# Patient Record
Sex: Female | Born: 1985 | Race: White | Hispanic: No | Marital: Married | Smoking: Former smoker
Health system: Southern US, Community
[De-identification: ages and names within clinical notes are randomized; demographics above are authoritative.]

## PROBLEM LIST (undated history)

## (undated) DIAGNOSIS — F419 Anxiety disorder, unspecified: Secondary | ICD-10-CM

## (undated) DIAGNOSIS — F32A Depression, unspecified: Secondary | ICD-10-CM

## (undated) DIAGNOSIS — F329 Major depressive disorder, single episode, unspecified: Secondary | ICD-10-CM

## (undated) DIAGNOSIS — R569 Unspecified convulsions: Secondary | ICD-10-CM

## (undated) HISTORY — DX: Anxiety disorder, unspecified: F41.9

## (undated) HISTORY — PX: ABDOMINAL HYSTERECTOMY: SHX81

## (undated) HISTORY — DX: Unspecified convulsions: R56.9

## (undated) HISTORY — PX: HERNIA REPAIR: SHX51

## (undated) HISTORY — PX: OVARIAN CYST SURGERY: SHX726

## (undated) HISTORY — PX: FRACTURE SURGERY: SHX138

---

## 2004-05-05 ENCOUNTER — Observation Stay: Payer: Self-pay

## 2004-05-10 ENCOUNTER — Observation Stay: Payer: Self-pay | Admitting: Obstetrics and Gynecology

## 2004-05-23 ENCOUNTER — Observation Stay: Payer: Self-pay

## 2004-06-01 ENCOUNTER — Inpatient Hospital Stay: Payer: Self-pay

## 2005-04-25 ENCOUNTER — Emergency Department: Payer: Self-pay | Admitting: Emergency Medicine

## 2005-07-22 ENCOUNTER — Emergency Department: Payer: Self-pay | Admitting: Emergency Medicine

## 2005-09-11 ENCOUNTER — Emergency Department: Payer: Self-pay | Admitting: Emergency Medicine

## 2005-09-26 ENCOUNTER — Emergency Department: Payer: Self-pay | Admitting: Unknown Physician Specialty

## 2006-01-26 ENCOUNTER — Observation Stay: Payer: Self-pay

## 2006-01-27 ENCOUNTER — Observation Stay: Payer: Self-pay | Admitting: Obstetrics and Gynecology

## 2006-01-30 ENCOUNTER — Observation Stay: Payer: Self-pay | Admitting: Obstetrics and Gynecology

## 2006-02-03 ENCOUNTER — Inpatient Hospital Stay: Payer: Self-pay

## 2006-05-12 ENCOUNTER — Emergency Department: Payer: Self-pay | Admitting: Emergency Medicine

## 2006-05-20 ENCOUNTER — Ambulatory Visit: Payer: Self-pay | Admitting: Emergency Medicine

## 2006-05-20 ENCOUNTER — Emergency Department: Payer: Self-pay | Admitting: Emergency Medicine

## 2006-07-11 ENCOUNTER — Ambulatory Visit: Payer: Self-pay | Admitting: Obstetrics and Gynecology

## 2006-08-06 ENCOUNTER — Emergency Department: Payer: Self-pay | Admitting: Emergency Medicine

## 2006-10-07 ENCOUNTER — Ambulatory Visit: Payer: Self-pay | Admitting: Obstetrics and Gynecology

## 2006-10-10 ENCOUNTER — Inpatient Hospital Stay: Payer: Self-pay | Admitting: Obstetrics and Gynecology

## 2007-04-07 ENCOUNTER — Emergency Department: Payer: Self-pay | Admitting: Internal Medicine

## 2007-04-17 ENCOUNTER — Emergency Department: Payer: Self-pay | Admitting: Emergency Medicine

## 2007-07-19 ENCOUNTER — Ambulatory Visit: Payer: Self-pay | Admitting: Family Medicine

## 2007-07-27 ENCOUNTER — Ambulatory Visit: Payer: Self-pay | Admitting: Family Medicine

## 2007-08-01 ENCOUNTER — Observation Stay: Payer: Self-pay

## 2007-10-02 ENCOUNTER — Observation Stay: Payer: Self-pay

## 2007-10-16 ENCOUNTER — Inpatient Hospital Stay: Payer: Self-pay

## 2008-02-21 ENCOUNTER — Emergency Department: Payer: Self-pay | Admitting: Unknown Physician Specialty

## 2008-03-05 ENCOUNTER — Emergency Department: Payer: Self-pay | Admitting: Emergency Medicine

## 2008-03-17 ENCOUNTER — Emergency Department: Payer: Self-pay | Admitting: Emergency Medicine

## 2008-04-06 ENCOUNTER — Ambulatory Visit: Payer: Self-pay | Admitting: Family Medicine

## 2008-05-15 ENCOUNTER — Emergency Department: Payer: Self-pay | Admitting: Emergency Medicine

## 2008-06-13 ENCOUNTER — Encounter: Payer: Self-pay | Admitting: Maternal & Fetal Medicine

## 2008-07-29 ENCOUNTER — Observation Stay: Payer: Self-pay

## 2008-09-10 ENCOUNTER — Observation Stay: Payer: Self-pay | Admitting: Obstetrics and Gynecology

## 2008-10-04 ENCOUNTER — Observation Stay: Payer: Self-pay

## 2008-10-06 ENCOUNTER — Observation Stay: Payer: Self-pay

## 2008-10-22 ENCOUNTER — Inpatient Hospital Stay: Payer: Self-pay

## 2009-02-13 ENCOUNTER — Ambulatory Visit: Payer: Self-pay | Admitting: Unknown Physician Specialty

## 2009-02-20 ENCOUNTER — Inpatient Hospital Stay: Payer: Self-pay | Admitting: Unknown Physician Specialty

## 2009-09-03 ENCOUNTER — Emergency Department: Payer: Self-pay | Admitting: Emergency Medicine

## 2010-07-03 ENCOUNTER — Ambulatory Visit: Payer: Self-pay | Admitting: Internal Medicine

## 2010-12-16 ENCOUNTER — Emergency Department: Payer: Self-pay | Admitting: Emergency Medicine

## 2010-12-23 ENCOUNTER — Emergency Department (HOSPITAL_COMMUNITY)
Admission: EM | Admit: 2010-12-23 | Discharge: 2010-12-23 | Disposition: A | Discharge status: Home / Self Care | Payer: Self-pay | Attending: Emergency Medicine | Admitting: Emergency Medicine

## 2010-12-23 DIAGNOSIS — R300 Dysuria: Secondary | ICD-10-CM | POA: Insufficient documentation

## 2010-12-23 DIAGNOSIS — N390 Urinary tract infection, site not specified: Secondary | ICD-10-CM | POA: Insufficient documentation

## 2010-12-23 DIAGNOSIS — R10819 Abdominal tenderness, unspecified site: Secondary | ICD-10-CM | POA: Insufficient documentation

## 2010-12-23 LAB — URINALYSIS, ROUTINE W REFLEX MICROSCOPIC
Bilirubin Urine: NEGATIVE
Nitrite: POSITIVE — AB
Specific Gravity, Urine: 1.033 — ABNORMAL HIGH (ref 1.005–1.030)
Urobilinogen, UA: 0.2 mg/dL (ref 0.0–1.0)
pH: 5.5 (ref 5.0–8.0)

## 2010-12-23 LAB — URINE MICROSCOPIC-ADD ON

## 2010-12-24 LAB — URINE CULTURE
Colony Count: NO GROWTH
Culture: NO GROWTH

## 2012-06-30 ENCOUNTER — Ambulatory Visit: Payer: Self-pay | Admitting: Family Medicine

## 2012-08-14 ENCOUNTER — Ambulatory Visit: Payer: Self-pay | Admitting: Family Medicine

## 2012-09-18 ENCOUNTER — Ambulatory Visit: Payer: Self-pay | Admitting: Primary Care

## 2012-12-01 ENCOUNTER — Emergency Department: Payer: Self-pay | Admitting: Emergency Medicine

## 2012-12-01 LAB — URINALYSIS, COMPLETE
Bilirubin,UR: NEGATIVE
Glucose,UR: NEGATIVE mg/dL (ref 0–75)
Ketone: NEGATIVE
Nitrite: NEGATIVE
Protein: NEGATIVE
Specific Gravity: 1.023 (ref 1.003–1.030)
Squamous Epithelial: 7
WBC UR: 7 /HPF (ref 0–5)

## 2012-12-17 ENCOUNTER — Emergency Department: Payer: Self-pay | Admitting: Emergency Medicine

## 2012-12-17 LAB — URINALYSIS, COMPLETE
Bilirubin,UR: NEGATIVE
Nitrite: NEGATIVE
Protein: 30
RBC,UR: 18 /HPF (ref 0–5)
WBC UR: 4 /HPF (ref 0–5)

## 2014-02-18 ENCOUNTER — Emergency Department: Payer: Self-pay | Admitting: Emergency Medicine

## 2014-02-18 LAB — URINALYSIS, COMPLETE
BACTERIA: NONE SEEN
BILIRUBIN, UR: NEGATIVE
Glucose,UR: NEGATIVE mg/dL (ref 0–75)
KETONE: NEGATIVE
NITRITE: NEGATIVE
PROTEIN: NEGATIVE
Ph: 6 (ref 4.5–8.0)
SPECIFIC GRAVITY: 1.016 (ref 1.003–1.030)
Squamous Epithelial: 1
WBC UR: 114 /HPF (ref 0–5)

## 2014-02-18 LAB — COMPREHENSIVE METABOLIC PANEL
ALBUMIN: 3.9 g/dL (ref 3.4–5.0)
ALK PHOS: 127 U/L — AB
ANION GAP: 10 (ref 7–16)
BUN: 13 mg/dL (ref 7–18)
Bilirubin,Total: 0.5 mg/dL (ref 0.2–1.0)
CALCIUM: 9.3 mg/dL (ref 8.5–10.1)
CHLORIDE: 105 mmol/L (ref 98–107)
CREATININE: 0.6 mg/dL (ref 0.60–1.30)
Co2: 28 mmol/L (ref 21–32)
Glucose: 100 mg/dL — ABNORMAL HIGH (ref 65–99)
Osmolality: 285 (ref 275–301)
POTASSIUM: 3.9 mmol/L (ref 3.5–5.1)
SGOT(AST): 23 U/L (ref 15–37)
SGPT (ALT): 35 U/L
SODIUM: 143 mmol/L (ref 136–145)
TOTAL PROTEIN: 7.9 g/dL (ref 6.4–8.2)

## 2014-02-18 LAB — CBC
HCT: 41.2 % (ref 35.0–47.0)
HGB: 13.7 g/dL (ref 12.0–16.0)
MCH: 29.7 pg (ref 26.0–34.0)
MCHC: 33.2 g/dL (ref 32.0–36.0)
MCV: 90 fL (ref 80–100)
PLATELETS: 230 10*3/uL (ref 150–440)
RBC: 4.59 10*6/uL (ref 3.80–5.20)
RDW: 13.5 % (ref 11.5–14.5)
WBC: 7.6 10*3/uL (ref 3.6–11.0)

## 2014-02-18 LAB — LIPASE, BLOOD: Lipase: 157 U/L (ref 73–393)

## 2014-02-20 ENCOUNTER — Emergency Department: Payer: Self-pay | Admitting: Internal Medicine

## 2014-02-20 LAB — COMPREHENSIVE METABOLIC PANEL
ALBUMIN: 3.7 g/dL (ref 3.4–5.0)
ALT: 40 U/L
ANION GAP: 6 — AB (ref 7–16)
AST: 43 U/L — AB (ref 15–37)
Alkaline Phosphatase: 119 U/L — ABNORMAL HIGH
BILIRUBIN TOTAL: 0.7 mg/dL (ref 0.2–1.0)
BUN: 8 mg/dL (ref 7–18)
CHLORIDE: 103 mmol/L (ref 98–107)
CO2: 28 mmol/L (ref 21–32)
CREATININE: 0.64 mg/dL (ref 0.60–1.30)
Calcium, Total: 8.7 mg/dL (ref 8.5–10.1)
EGFR (African American): >60
EGFR (Non-African Amer.): >60
GLUCOSE: 68 mg/dL (ref 65–99)
OSMOLALITY: 270 (ref 275–301)
Potassium: 4.1 mmol/L (ref 3.5–5.1)
SODIUM: 137 mmol/L (ref 136–145)
Total Protein: 8.1 g/dL (ref 6.4–8.2)

## 2014-02-20 LAB — CBC WITH DIFFERENTIAL/PLATELET
Basophil #: 0 10*3/uL (ref 0.0–0.1)
Basophil %: 0.7 %
EOS ABS: 0.3 10*3/uL (ref 0.0–0.7)
EOS PCT: 4.7 %
HCT: 40.7 % (ref 35.0–47.0)
HGB: 13.3 g/dL (ref 12.0–16.0)
LYMPHS ABS: 2.3 10*3/uL (ref 1.0–3.6)
LYMPHS PCT: 33.4 %
MCH: 29.6 pg (ref 26.0–34.0)
MCHC: 32.6 g/dL (ref 32.0–36.0)
MCV: 91 fL (ref 80–100)
MONO ABS: 0.5 x10 3/mm (ref 0.2–0.9)
MONOS PCT: 7.5 %
Neutrophil #: 3.7 10*3/uL (ref 1.4–6.5)
Neutrophil %: 53.7 %
Platelet: 225 10*3/uL (ref 150–440)
RBC: 4.5 10*6/uL (ref 3.80–5.20)
RDW: 13.6 % (ref 11.5–14.5)
WBC: 6.9 10*3/uL (ref 3.6–11.0)

## 2014-02-20 LAB — URINALYSIS, COMPLETE
BLOOD: NEGATIVE
Bacteria: NONE SEEN
Bilirubin,UR: NEGATIVE
GLUCOSE, UR: NEGATIVE mg/dL (ref 0–75)
Ketone: NEGATIVE
Nitrite: NEGATIVE
PH: 6 (ref 4.5–8.0)
Protein: NEGATIVE
RBC,UR: 52 /HPF (ref 0–5)
SPECIFIC GRAVITY: 1.012 (ref 1.003–1.030)
Squamous Epithelial: 3
WBC UR: 102 /HPF (ref 0–5)

## 2014-02-20 LAB — LIPASE, BLOOD: LIPASE: 84 U/L (ref 73–393)

## 2014-07-10 ENCOUNTER — Emergency Department: Payer: Self-pay | Admitting: Student

## 2014-07-10 LAB — COMPREHENSIVE METABOLIC PANEL
ALBUMIN: 4 g/dL (ref 3.4–5.0)
ALK PHOS: 147 U/L — AB
ALT: 54 U/L
AST: 40 U/L — AB (ref 15–37)
Anion Gap: 9 (ref 7–16)
BUN: 16 mg/dL (ref 7–18)
Bilirubin,Total: 0.8 mg/dL (ref 0.2–1.0)
CHLORIDE: 102 mmol/L (ref 98–107)
Calcium, Total: 8.9 mg/dL (ref 8.5–10.1)
Co2: 26 mmol/L (ref 21–32)
Creatinine: 0.7 mg/dL (ref 0.60–1.30)
EGFR (African American): >60
GLUCOSE: 88 mg/dL (ref 65–99)
OSMOLALITY: 274 (ref 275–301)
Potassium: 3.4 mmol/L — ABNORMAL LOW (ref 3.5–5.1)
SODIUM: 137 mmol/L (ref 136–145)
Total Protein: 8.1 g/dL (ref 6.4–8.2)

## 2014-07-10 LAB — DRUG SCREEN, URINE
Amphetamines, Ur Screen: NEGATIVE (ref ?–1000)
BARBITURATES, UR SCREEN: NEGATIVE (ref ?–200)
BENZODIAZEPINE, UR SCRN: NEGATIVE (ref ?–200)
COCAINE METABOLITE, UR ~~LOC~~: NEGATIVE (ref ?–300)
Cannabinoid 50 Ng, Ur ~~LOC~~: POSITIVE (ref ?–50)
MDMA (ECSTASY) UR SCREEN: NEGATIVE (ref ?–500)
METHADONE, UR SCREEN: NEGATIVE (ref ?–300)
Opiate, Ur Screen: NEGATIVE (ref ?–300)
PHENCYCLIDINE (PCP) UR S: NEGATIVE (ref ?–25)
TRICYCLIC, UR SCREEN: NEGATIVE (ref ?–1000)

## 2014-07-10 LAB — CBC
HCT: 41.4 % (ref 35.0–47.0)
HGB: 13.6 g/dL (ref 12.0–16.0)
MCH: 29.1 pg (ref 26.0–34.0)
MCHC: 32.9 g/dL (ref 32.0–36.0)
MCV: 88 fL (ref 80–100)
Platelet: 299 10*3/uL (ref 150–440)
RBC: 4.68 10*6/uL (ref 3.80–5.20)
RDW: 13.2 % (ref 11.5–14.5)
WBC: 10.2 10*3/uL (ref 3.6–11.0)

## 2014-07-10 LAB — ETHANOL: Ethanol: <3 mg/dL

## 2014-07-10 LAB — URINALYSIS, COMPLETE
BILIRUBIN, UR: NEGATIVE
Glucose,UR: NEGATIVE mg/dL (ref 0–75)
KETONE: NEGATIVE
Nitrite: POSITIVE
PH: 5 (ref 4.5–8.0)
Protein: NEGATIVE
SPECIFIC GRAVITY: 1.029 (ref 1.003–1.030)
WBC UR: 17 /HPF (ref 0–5)

## 2014-07-10 LAB — PREGNANCY, URINE: PREGNANCY TEST, URINE: NEGATIVE m[IU]/mL

## 2014-07-10 LAB — ACETAMINOPHEN LEVEL

## 2014-07-10 LAB — SALICYLATE LEVEL

## 2014-11-16 NOTE — Consult Note (Signed)
PATIENT NAME:  Morgan Daniel, Morgan Daniel MR#:  [id number] DATE OF BIRTH:  [birth year]  DATE OF CONSULTATION:  07/10/2014  REFERRING PHYSICIAN:   CONSULTING PHYSICIAN:  Audery Amel, MD  IDENTIFYING INFORMATION AND REASON FOR CONSULTATION: A 29 year old woman who presented to the Emergency Room stating she was having suicidal ideation.   HISTORY OF PRESENT ILLNESS: Information obtained from the patient and the chart. The patient came in last night stating that she was having thoughts about killing herself. She was upset and agitated about a break-up with her boyfriend. Today, during interview with me, the patient says that she made those comments yesterday, but she never really meant it. She said that she was just upset at the time, but has not had any actual intention or plan of doing anything to hurt herself. She has been upset for the last couple of days since she broke up with her boyfriend. The two of them in been living together, and then he became convinced that she had slept with someone else and threw her out of the house. Since then, she stayed at the homeless shelter. She says, prior to that, she has had chronic depression, but had not been feeling any worse than usual recently. She sleeps usually pretty adequately at night. Appetite had been poor recently. Denies ever having any hallucinations. Denies that she has been drinking or using any drugs. Says that she has been compliant with her usual prescription medicines, which are Cymbalta, Xanax and Ambien.   PAST PSYCHIATRIC HISTORY: Long history of depression. No hospitalizations at our facility. She has had a suicide attempt at age 71 by overdose, but none other since then. No other hospitalizations. She currently sees Dr. [name] at [facility]. She is taking Cymbalta, Xanax and Ambien. She says other medications she has taken in the past have not worked.   FAMILY HISTORY: Does not know for sure of any family history of mental illness.   SOCIAL HISTORY: The  patient currently is homeless, having been thrown out by her fiance. She is not working right now, but does not get disability. She has 4 children, but none of them are in her custody or care. Kind of at loose ends for a place to stay right now.   PAST MEDICAL HISTORY: Does not have any significant ongoing medical problems.   CURRENT MEDICATIONS: Per her report: Cymbalta 20 mg twice a day, Xanax 1 mg 2 to 3 times a day, Ambien 10 mg at night.  ALLERGIES: No known drug allergies.   REVIEW OF SYSTEMS: Mood is still mildly depressed. Denies suicidal ideation. Denies homicidal ideation. Denies any psychotic symptoms. She does not have any acute physical symptoms.   MENTAL STATUS EXAMINATION: Adequately groomed woman, looks her stated age, cooperative with the interview. Eye contact good. Psychomotor activity normal. Speech normal rate, tone and volume. Affect is euthymic, reactive, and appropriate. Mood stated as okay. Thoughts are lucid without loosening of associations or delusions. Denies auditory or visual hallucinations. Denies any suicidal or homicidal ideation. She can recall 3/3 objects immediately;  2/3 at 3 minutes. Judgment and insight adequate. Baseline intelligence normal.   LABORATORY RESULTS: Drug screen is only positive for cannabis, which is slightly interesting since,  if she is taking her Xanax, she should have had benzodiazepines. Chemistry Panel: Slightly elevated alkaline phosphatase 147. Elevated AST at 40; nothing else notable. CBC all normal.   URINALYSIS: Positive blood, positive white cells, possible infection. Salicylates and acetaminophen negative. Pregnancy test negative.   VITAL  SIGNS: Blood pressure currently 96/54, respirations 20, pulse 65, temperature 96.9.   SUBSTANCE ABUSE HISTORY: Denies using alcohol, says she uses marijuana only occasionally. Denies other substance abuse problems.   ASSESSMENT: A 29 year old woman with chronic depression. Acute stresses got her  upset and she made suicidal statements, but now says she never had any intent to act on it and did not actually act on it. Totally denies suicidal ideation now. Able to articulate a positive plan for the future. The patient does not need hospital level care, nor meets involuntary commitment criteria.   TREATMENT PLAN: Discontinue involuntary commitment. Psychoeducation and supportive counseling completed. The patient is to follow up with Dr. Janeece RiggersSu. Continue outpatient medicine. Strongly encouraged not to abuse any drugs.   DIAGNOSIS, PRINCIPAL AND PRIMARY:  AXIS I: Depression, not otherwise specified.   SECONDARY DIAGNOSES:  AXIS I: No further.  AXIS II: No diagnosis.  AXIS III: No diagnosis.    ____________________________ Audery AmelJohn T. Clapacs, MD jtc:MT D: 07/10/2014 13:11:17 ET T: 07/10/2014 13:43:50 ET JOB#: [id number]  cc: Audery AmelJohn T. Clapacs, MD, <Dictator> Audery AmelJOHN T CLAPACS MD ELECTRONICALLY SIGNED 07/17/2014 0:39

## 2014-12-05 ENCOUNTER — Emergency Department (HOSPITAL_COMMUNITY)
Admission: EM | Admit: 2014-12-05 | Discharge: 2014-12-06 | Disposition: A | Discharge status: Home / Self Care | Payer: Medicaid Other | Attending: Emergency Medicine | Admitting: Emergency Medicine

## 2014-12-05 ENCOUNTER — Encounter (HOSPITAL_COMMUNITY): Payer: Self-pay | Admitting: Emergency Medicine

## 2014-12-05 DIAGNOSIS — Z720 Tobacco use: Secondary | ICD-10-CM | POA: Diagnosis not present

## 2014-12-05 DIAGNOSIS — F1410 Cocaine abuse, uncomplicated: Secondary | ICD-10-CM | POA: Insufficient documentation

## 2014-12-05 DIAGNOSIS — F1210 Cannabis abuse, uncomplicated: Secondary | ICD-10-CM | POA: Insufficient documentation

## 2014-12-05 DIAGNOSIS — Z008 Encounter for other general examination: Secondary | ICD-10-CM | POA: Diagnosis present

## 2014-12-05 LAB — CBC
HEMATOCRIT: 44.3 % (ref 36.0–46.0)
Hemoglobin: 14.3 g/dL (ref 12.0–15.0)
MCH: 29.1 pg (ref 26.0–34.0)
MCHC: 32.3 g/dL (ref 30.0–36.0)
MCV: 90 fL (ref 78.0–100.0)
PLATELETS: 259 10*3/uL (ref 150–400)
RBC: 4.92 MIL/uL (ref 3.87–5.11)
RDW: 13.5 % (ref 11.5–15.5)
WBC: 9.2 10*3/uL (ref 4.0–10.5)

## 2014-12-05 LAB — RAPID URINE DRUG SCREEN, HOSP PERFORMED
AMPHETAMINES: NOT DETECTED
BENZODIAZEPINES: NOT DETECTED
Barbiturates: NOT DETECTED
COCAINE: NOT DETECTED
OPIATES: NOT DETECTED
Tetrahydrocannabinol: POSITIVE — AB

## 2014-12-05 LAB — COMPREHENSIVE METABOLIC PANEL
ALT: 38 U/L (ref 14–54)
AST: 26 U/L (ref 15–41)
Albumin: 4.8 g/dL (ref 3.5–5.0)
Alkaline Phosphatase: 106 U/L (ref 38–126)
Anion gap: 11 (ref 5–15)
BUN: 14 mg/dL (ref 6–20)
CALCIUM: 10 mg/dL (ref 8.9–10.3)
CO2: 26 mmol/L (ref 22–32)
CREATININE: 0.81 mg/dL (ref 0.44–1.00)
Chloride: 104 mmol/L (ref 101–111)
GLUCOSE: 100 mg/dL — AB (ref 65–99)
Potassium: 3.7 mmol/L (ref 3.5–5.1)
SODIUM: 141 mmol/L (ref 135–145)
Total Bilirubin: 1 mg/dL (ref 0.3–1.2)
Total Protein: 8.5 g/dL — ABNORMAL HIGH (ref 6.5–8.1)

## 2014-12-05 LAB — SALICYLATE LEVEL

## 2014-12-05 LAB — ACETAMINOPHEN LEVEL

## 2014-12-05 LAB — I-STAT BETA HCG BLOOD, ED (MC, WL, AP ONLY)

## 2014-12-05 LAB — ETHANOL: Alcohol, Ethyl (B): <5 mg/dL (ref <5)

## 2014-12-05 NOTE — ED Provider Notes (Signed)
CSN: [id number]     Arrival date & time 12/05/14  2034 History   First MD Initiated Contact with Patient 12/05/14 2213     Chief Complaint  Patient presents with  . Medical Clearance     (Consider location/radiation/quality/duration/timing/severity/associated sxs/prior Treatment) The history is provided by the patient and medical records.    29 year old female with no significant past medical history presenting to the ED for medical clearance in order to begin treatment for detox from cocaine at facility in [place]. Patient states she began using cocaine and January 2016 and has been on a downward spiral since this time. She states she injects, snorts, and smokes crack.  Last use was yesterday.  She also admits to occasional marijuana and alcohol use. She denies any suicidal or homicidal ideation. She denies any auditory or visual hallucinations.  Patient has never attempted detox use in the past.  She states her motivation to get clean is to get back on the right track and to get her children back.  Her children are currently living with her mother and she is [place] a [place] in the woods.  She denies any current physical complaints.  History reviewed. No pertinent past medical history. Past Surgical History  Procedure Laterality Date  . Hernia repair    . Ovarian cyst surgery    . Abdominal hysterectomy    . Fracture surgery     No family history on file. History  Substance Use Topics  . Smoking status: Current Every Day Smoker  . Smokeless tobacco: Not on file  . Alcohol Use: Yes   OB History    No data available     Review of Systems  Psychiatric/Behavioral:       Med clearance  All other systems reviewed and are negative.     Allergies  Review of patient's allergies indicates not on file.  Home Medications   Prior to Admission medications   Not on File   BP 128/78 mmHg  Pulse 78  Temp(Src) 98.1 F (36.7 C) (Oral)  Resp 18  SpO2 100%   Physical Exam   Constitutional: She is oriented to person, place, and time. She appears well-developed and well-nourished. No distress.  HENT:  Head: Normocephalic and atraumatic.  Mouth/Throat: Oropharynx is clear and moist.  Eyes: Conjunctivae and EOM are normal. Pupils are equal, round, and reactive to light.  Neck: Normal range of motion.  Cardiovascular: Normal rate, regular rhythm and normal heart sounds.   Pulmonary/Chest: Effort normal and breath sounds normal. No respiratory distress. She has no wheezes.  Abdominal: Soft. Bowel sounds are normal.  Musculoskeletal: Normal range of motion.  Neurological: She is alert and oriented to person, place, and time.  Skin: Skin is warm and dry. She is not diaphoretic.  Psychiatric: She has a normal mood and affect. She is not actively hallucinating. She expresses no homicidal and no suicidal ideation. She expresses no suicidal plans and no homicidal plans.  Nursing note and vitals reviewed.   ED Course  Procedures (including critical care time) Labs Review Labs Reviewed  ACETAMINOPHEN LEVEL - Abnormal; Notable for the following:    Acetaminophen (Tylenol), Serum <10 (*)    All other components within normal limits  COMPREHENSIVE METABOLIC PANEL - Abnormal; Notable for the following:    Glucose, Bld 100 (*)    Total Protein 8.5 (*)    All other components within normal limits  URINE RAPID DRUG SCREEN (HOSP PERFORMED) - Abnormal; Notable for the following:  Tetrahydrocannabinol POSITIVE (*)    All other components within normal limits  CBC  ETHANOL  SALICYLATE LEVEL  I-STAT BETA HCG BLOOD, ED ([facility], [facility], [facility] ONLY)    Imaging Review No results found.   EKG Interpretation None      MDM   Final diagnoses:  Cocaine abuse   29 year old female here for medical clearance so she can begin outpatient detox program in [place].  Denies SI/HU/AVH.  No current physical complaints.  Lab work obtained as above-- [facility].  Patient medically  cleared and discharged home to begin her OP detox program.  Garlon HatchetLisa M Copper Basnett, Cordelia Poche-C 12/05/14 2351  Zadie Rhineonald Wickline, MD 12/06/14 [phone]

## 2014-12-05 NOTE — ED Notes (Signed)
Paperwork faxed to [facility], waiting on response

## 2014-12-05 NOTE — ED Notes (Addendum)
Pt here for medical clearance in order to go to [facility] for detox from cocaine. Pt sts that she got into cocaine in January and is now living in a tent using cocaine, unable to function in daily life. Pt denies SI/HI. Pt sts she also uses marijuana.  Pt A&Ox4 and ambulatory. Pt sts she has been clean since Wednesday.

## 2014-12-05 NOTE — Discharge Instructions (Signed)
Your lab work looked ok today, no acute findings. You are cleared to begin your treatment. Return here for new or worsening symptoms.

## 2015-06-04 ENCOUNTER — Ambulatory Visit
Admission: EM | Admit: 2015-06-04 | Discharge: 2015-06-04 | Disposition: A | Discharge status: Home / Self Care | Payer: Medicaid Other | Attending: Family Medicine | Admitting: Family Medicine

## 2015-06-04 ENCOUNTER — Encounter: Payer: Self-pay | Admitting: Emergency Medicine

## 2015-06-04 DIAGNOSIS — B354 Tinea corporis: Secondary | ICD-10-CM | POA: Insufficient documentation

## 2015-06-04 DIAGNOSIS — J029 Acute pharyngitis, unspecified: Secondary | ICD-10-CM | POA: Diagnosis present

## 2015-06-04 DIAGNOSIS — R05 Cough: Secondary | ICD-10-CM | POA: Diagnosis not present

## 2015-06-04 DIAGNOSIS — J020 Streptococcal pharyngitis: Secondary | ICD-10-CM | POA: Diagnosis not present

## 2015-06-04 DIAGNOSIS — R059 Cough, unspecified: Secondary | ICD-10-CM

## 2015-06-04 DIAGNOSIS — B950 Streptococcus, group A, as the cause of diseases classified elsewhere: Secondary | ICD-10-CM | POA: Insufficient documentation

## 2015-06-04 DIAGNOSIS — R51 Headache: Secondary | ICD-10-CM | POA: Diagnosis present

## 2015-06-04 HISTORY — DX: Major depressive disorder, single episode, unspecified: F32.9

## 2015-06-04 HISTORY — DX: Depression, unspecified: F32.A

## 2015-06-04 LAB — RAPID STREP SCREEN (MED CTR MEBANE ONLY): STREPTOCOCCUS, GROUP A SCREEN (DIRECT): POSITIVE — AB

## 2015-06-04 MED ORDER — LIDOCAINE VISCOUS 2 % MT SOLN: OROMUCOSAL | Status: DC

## 2015-06-04 MED ORDER — GUAIFENESIN-CODEINE 100-10 MG/5ML PO SOLN: 10.0000 mL | Freq: Four times a day (QID) | ORAL | Status: DC | PRN

## 2015-06-04 MED ORDER — PENICILLIN G BENZATHINE 1200000 UNIT/2ML IM SUSP
1.2000 10*6.[IU] | Freq: Once | INTRAMUSCULAR | Status: AC
  Administered 2015-06-04: 1.2 10*6.[IU] via INTRAMUSCULAR

## 2015-06-04 NOTE — ED Notes (Signed)
Patient c/o sore throat, abdominal pain, back pain since Saturday.  Patient reports fevers.

## 2015-06-04 NOTE — ED Provider Notes (Signed)
CSN: [id number]     Arrival date & time 06/04/15  1104 History   First MD Initiated Contact with Patient 06/04/15 1311     Chief Complaint  Patient presents with  . Headache  . Sore Throat   (Consider location/radiation/quality/duration/timing/severity/associated sxs/prior Treatment) Patient is a 29 y.o. female presenting with URI. The history is provided by the patient.  URI Presenting symptoms: cough, fever and sore throat   Presenting symptoms: no ear pain   Presenting symptoms comment:  Patient also complains of a tender, itchy rash to left lower abdomen skin  Severity:  Moderate Onset quality:  Sudden Duration:  5 days Timing:  Constant Progression:  Worsening Chronicity:  New Relieved by:  Nothing Ineffective treatments:  OTC medications Associated symptoms: swollen glands   Associated symptoms: no arthralgias, no headaches, no myalgias, no neck pain, no sinus pain, no sneezing and no wheezing     Past Medical History  Diagnosis Date  . Depression    Past Surgical History  Procedure Laterality Date  . Hernia repair    . Ovarian cyst surgery    . Abdominal hysterectomy    . Fracture surgery     History reviewed. No pertinent family history. Social History  Substance Use Topics  . Smoking status: Former Games developermoker  . Smokeless tobacco: None  . Alcohol Use: Yes   OB History    No data available     Review of Systems  Constitutional: Positive for fever.  HENT: Positive for sore throat. Negative for ear pain and sneezing.   Respiratory: Positive for cough. Negative for wheezing.   Musculoskeletal: Negative for myalgias, arthralgias and neck pain.  Neurological: Negative for headaches.    Allergies  Review of patient's allergies indicates no known allergies.  Home Medications   Prior to Admission medications   Medication Sig Start Date End Date Taking? Authorizing Provider  ALPRAZolam Prudy Feeler(XANAX) 1 MG tablet Take 1 mg by mouth 4 (four) times daily as needed for  anxiety.     Historical Provider, MD  Brexpiprazole (REXULTI PO) Take 1 mg by mouth daily.     Historical Provider, MD  DULoxetine HCl (CYMBALTA PO) Take 30 mg by mouth 2 (two) times daily.     Historical Provider, MD  guaiFENesin-codeine 100-10 MG/5ML syrup Take 10 mLs by mouth every 6 (six) hours as needed for cough. 06/04/15   Payton Mccallumrlando Zeya Balles, MD  lidocaine (XYLOCAINE) 2 % solution 20 ml gargle and spit q 6 hours prn 06/04/15   Payton Mccallumrlando Julianna Vanwagner, MD  Zolpidem Tartrate (AMBIEN PO) Take 10 mg by mouth daily.     Historical Provider, MD   Meds Ordered and Administered this Visit   Medications  penicillin g benzathine (BICILLIN LA) 1200000 UNIT/2ML injection 1.2 Million Units (1.2 Million Units Intramuscular Given 06/04/15 1346)    BP 128/69 mmHg  Pulse 78  Temp(Src) 98.3 F (36.8 C) (Tympanic)  Resp 16  Ht 5\' 5"  (1.651 m)  Wt 250 lb (113.399 kg)  BMI 41.60 kg/m2  SpO2 100% No data found.   Physical Exam  Constitutional: She appears well-developed and well-nourished. No distress.  HENT:  Head: Normocephalic.  Right Ear: Tympanic membrane and external ear normal.  Left Ear: Tympanic membrane and external ear normal.  Nose: Nose normal.  Mouth/Throat: Uvula is midline. Oropharyngeal exudate and posterior oropharyngeal erythema present. No posterior oropharyngeal edema or tonsillar abscesses.  Eyes: Conjunctivae are normal.  Neck: Normal range of motion. Neck supple. No thyromegaly present.  Cardiovascular: Normal rate,  regular rhythm and normal heart sounds.   No murmur heard. Pulmonary/Chest: Effort normal and breath sounds normal. No respiratory distress. She has no wheezes. She has no rales.  Lymphadenopathy:    She has cervical adenopathy.  Skin: Rash noted. She is not diaphoretic. There is erythema.  To left lower abdomen skin under pannus  Nursing note and vitals reviewed.   ED Course  Procedures (including critical care time)  Labs Review Labs Reviewed  RAPID STREP SCREEN  (NOT AT [facility]) - Abnormal; Notable for the following:    Streptococcus, Group A Screen (Direct) POSITIVE (*)    All other components within normal limits    Imaging Review No results found.   Visual Acuity Review  Right Eye Distance:   Left Eye Distance:   Bilateral Distance:    Right Eye Near:   Left Eye Near:    Bilateral Near:         MDM   1. Strep pharyngitis   2. Cough   3. Tinea corporis    New Prescriptions   GUAIFENESIN-CODEINE 100-10 MG/5ML SYRUP    Take 10 mLs by mouth every 6 (six) hours as needed for cough.   LIDOCAINE (XYLOCAINE) 2 % SOLUTION    20 ml gargle and spit q 6 hours prn   1. Lab result (positive strep) and diagnosis reviewed with patient 2. rx as per orders above; reviewed possible side effects, interactions, risks and benefits  3. Patient given Bicillin LA 1.78mU IM x1 4. Recommend supportive treatment with otc analgesics, salt water gargles, otc anti-fungal powder to skin under pannus 5. Follow-up  prn if symptoms worsen or don't improve    Payton Mccallum, MD 06/04/15 1347

## 2015-09-01 ENCOUNTER — Ambulatory Visit: Payer: Medicaid Other | Admitting: Urology

## 2015-09-04 ENCOUNTER — Encounter: Payer: Self-pay | Admitting: Urology

## 2015-09-04 ENCOUNTER — Ambulatory Visit (INDEPENDENT_AMBULATORY_CARE_PROVIDER_SITE_OTHER): Payer: Medicaid Other | Admitting: Urology

## 2015-09-04 VITALS — BP 126/84 | HR 78 | Ht 64.0 in | Wt 245.1 lb

## 2015-09-04 DIAGNOSIS — R109 Unspecified abdominal pain: Secondary | ICD-10-CM

## 2015-09-04 DIAGNOSIS — R310 Gross hematuria: Secondary | ICD-10-CM

## 2015-09-04 NOTE — Progress Notes (Signed)
09/04/2015 2:38 PM   Morgan Daniel Eartha Inch 18-Jan-1986 [id number]  Referring provider: [facility] [address] [place], [place] [address]  Chief Complaint  Patient presents with  . Pyelonephritis    referred by [facility]  . Hematuria    HPI: Patient is a 30 year old Caucasian female with a history of nephrolithiasis who presents today at the request of her primary care [facility]'s [facility] for further evaluation and management.  Patient states for the last 3 weeks she's had left flank pain radiating to the left waist.  She's had urinary frequency, urgency, dysuria, nocturia, intermittency, gross hematuria, urinary tract infection, vaginal bleeding and painful intercourse.  Her urine culture from her primary care physician's office was negative for infection.  Her urinalysis with [place] today is unremarkable.  Patient states that the pain is intense at times.  She does state it is a constant dull ache.    She states she had seen Dr. Garner Nash in the past for her kidney stones. She stated he passed spontaneously. Her stone composition is unknown.   PMH: Past Medical History  Diagnosis Date  . Depression   . Anxiety   . Seizures [facility])     Surgical History: Past Surgical History  Procedure Laterality Date  . Hernia repair    . Ovarian cyst surgery    . Abdominal hysterectomy    . Fracture surgery      Home Medications:    Medication List       This list is accurate as of: 09/04/15  2:38 PM.  Always use your most recent med list.               ALPRAZolam 1 MG tablet  Commonly known as:  XANAX  Take 1 mg by mouth 4 (four) times daily as needed for anxiety.     AMBIEN PO  Take 10 mg by mouth daily. Reported on 09/04/2015     CYMBALTA PO  Take 30 mg by mouth 2 (two) times daily. Reported on 09/04/2015     gabapentin 300 MG capsule  Commonly known as:  NEURONTIN  Reported on 09/04/2015     guaiFENesin-codeine 100-10 MG/5ML syrup  Take 10 mLs by mouth every 6 (six) hours as needed for cough.     lidocaine 2 % solution  Commonly known as:  XYLOCAINE  20 ml gargle and spit q 6 hours prn     oxyCODONE-acetaminophen 5-325 MG tablet  Commonly known as:  PERCOCET/ROXICET  Reported on 09/04/2015     PROVENTIL HFA 108 (90 Base) MCG/ACT inhaler  Generic drug:  albuterol  Inhale 2 puffs into the lungs every 4 (four) hours as needed.     REXULTI 2 MG Tabs  Generic drug:  Brexpiprazole  Take 1 tablet by mouth daily.     traMADol 50 MG tablet  Commonly known as:  ULTRAM  Take 50 mg by mouth. Reported on 09/04/2015     traZODone 100 MG tablet  Commonly known as:  DESYREL  Take 100 mg by mouth at bedtime as needed.        Allergies:  Allergies  Allergen Reactions  . Vicodin [Hydrocodone-Acetaminophen]     Family History: Family History  Problem Relation Age of Onset  . Kidney disease Neg Hx   . Bladder Cancer Neg Hx     Social History:  reports that she has quit smoking. She does not have any smokeless tobacco history  on file. She reports that she does not drink alcohol or use illicit drugs.  ROS: UROLOGY Frequent Urination?: Yes Hard to postpone urination?: Yes Burning/pain with urination?: Yes Get up at night to urinate?: Yes Leakage of urine?: No Urine stream starts and stops?: Yes Trouble starting stream?: No Do you have to strain to urinate?: No Blood in urine?: Yes Urinary tract infection?: Yes Sexually transmitted disease?: No Injury to kidneys or bladder?: No Painful intercourse?: Yes Weak stream?: Yes Currently pregnant?: No Vaginal bleeding?: No Last menstrual period?: n  Gastrointestinal Nausea?: Yes Vomiting?: Yes Indigestion/heartburn?: No Diarrhea?: No Constipation?: No  Constitutional Fever: No Night sweats?: Yes Weight loss?: No Fatigue?: No  Skin Skin rash/lesions?: No Itching?: No  Eyes Blurred vision?: No Double  vision?: No  Ears/Nose/Throat Sore throat?: No Sinus problems?: No  Hematologic/Lymphatic Swollen glands?: No Easy bruising?: No  Cardiovascular Leg swelling?: No Chest pain?: Yes  Respiratory Cough?: No Shortness of breath?: Yes  Endocrine Excessive thirst?: No  Musculoskeletal Back pain?: Yes Joint pain?: No  Neurological Headaches?: Yes Dizziness?: Yes  Psychologic Depression?: Yes Anxiety?: Yes  Physical Exam: BP 126/84 mmHg  Pulse 78  Ht  (1.626 m)  Wt 245 lb 1.6 oz (111.177 kg)  BMI 42.05 kg/m2  Constitutional: Well nourished. Alert and oriented, No acute distress. HEENT: [place] AT, moist mucus membranes. Trachea midline, no masses. Cardiovascular: No clubbing, cyanosis, or edema. Respiratory: Normal respiratory effort, no increased work of breathing. GI: Abdomen is soft, non tender, non distended, no abdominal masses. Liver and spleen not palpable.  No hernias appreciated.  Stool sample for occult testing is not indicated.   GU: No CVA tenderness.  No bladder fullness or masses.  Normal external genitalia, normal pubic hair distribution, no lesions.  Normal urethral meatus, no lesions, no prolapse, no discharge.   No urethral masses, tenderness and/or tenderness. No bladder fullness, tenderness or masses. Normal vagina mucosa, good estrogen effect, no discharge, no lesions, good pelvic support, Grade I cystocele is noted.  No rectocele noted.  Uterus, cervix and ovaries are surgically absent.  Anus and perineum are without rashes or lesions.   No blood seen in the vaginal vault.   Skin: No rashes, bruises or suspicious lesions. Lymph: No cervical or inguinal adenopathy. Neurologic: Grossly intact, no focal deficits, moving all 4 extremities. Psychiatric: Normal mood and affect.  Laboratory Data: Lab Results  Component Value Date   WBC 9.2 12/05/2014   HGB 14.3 12/05/2014   HCT 44.3 12/05/2014   MCV 90.0 12/05/2014   PLT 259 12/05/2014   Lab Results   Component Value Date   CREATININE 0.81 12/05/2014   Lab Results  Component Value Date   AST 26 12/05/2014   Lab Results  Component Value Date   ALT 38 12/05/2014     Urinalysis Results for orders placed or performed in visit on 09/04/15  CULTURE, URINE COMPREHENSIVE  Result Value Ref Range   Urine Culture, Comprehensive Final report    Result 1 Comment   Microscopic Examination  Result Value Ref Range   WBC, UA 0-5 0 -  5 /hpf   RBC, UA 0-3 0 -  2 /hpf   Epithelial Cells (non renal) >10 (A) 0 - 10 /hpf   Crystals Present N/A   Crystal Type Calcium Oxalate N/A   Bacteria, UA None seen None seen/Few  Urinalysis, Complete  Result Value Ref Range   Specific Gravity, UA 1.025 1.005 - 1.030   pH, UA 6.5 5.0 -  7.5   Color, UA Yellow Yellow   Appearance Ur Cloudy (A) Clear   Leukocytes, UA Negative Negative   Protein, UA Negative Negative/Trace   Glucose, UA Negative Negative   Ketones, UA Negative Negative   RBC, UA Trace (A) Negative   Bilirubin, UA Negative Negative   Urobilinogen, Ur 2.0 (H) 0.2 - 1.0 mg/dL   Nitrite, UA Negative Negative   Microscopic Examination See below:   BUN+Creat  Result Value Ref Range   BUN 11 6 - 20 mg/dL   Creatinine, Ser 4.09 0.57 - 1.00 mg/dL   GFR calc non Af Amer 121 >59 mL/min/1.73   GFR calc Af Amer 139 >59 mL/min/1.73   BUN/Creatinine Ratio 18 8 - 20    Assessment & Plan:    1. Gross hematuria:   Explained to patient the causes of blood in the urine are as follows: stones, UTI's, damage to the urinary tract and/or cancer.  It is explained to the patient that they will be scheduled for a CT Urogram with contrast material and that in rare instances, an allergic reaction can be serious and even life threatening with the injection of contrast material.   The patient denies any allergies to contrast, iodine and/or seafood and is not taking metformin.  I have explained to the patient that they will  be scheduled for a cystoscopy in our  office to evaluate their bladder.  The cystoscopy consists of passing a tube with a lens up through their urethra and into their urinary bladder.   We will inject the urethra with a lidocaine gel prior to introducing the cystoscope to help with any discomfort during the procedure.   After the procedure, they might experience blood in the urine and discomfort with urination.  This will abate after the first few voids.  I have  encouraged the patient to increase water intake  during this time.  Patient denies any allergies to lidocaine.    - Urinalysis, Complete - CULTURE, URINE COMPREHENSIVE - BUN + Creatinine   2. Left flank pain:   Patient will be undergoing a CT Urogram in the future.     Return for CT Urogram report and cystoscopy.  These notes generated with voice recognition software. I apologize for typographical errors.  Michiel Cowboy, PA-C  [facility] [address], [address] [place], [place] [address] [phone]

## 2015-09-05 LAB — BUN+CREAT
BUN / CREAT RATIO: 18 (ref 8–20)
BUN: 11 mg/dL (ref 6–20)
CREATININE: 0.62 mg/dL (ref 0.57–1.00)
GFR calc non Af Amer: 121 mL/min/{1.73_m2} (ref >59)
GFR, EST AFRICAN AMERICAN: 139 mL/min/{1.73_m2} (ref >59)

## 2015-09-05 LAB — URINALYSIS, COMPLETE
Bilirubin, UA: NEGATIVE
GLUCOSE, UA: NEGATIVE
Ketones, UA: NEGATIVE
Leukocytes, UA: NEGATIVE
Nitrite, UA: NEGATIVE
PH UA: 6.5 (ref 5.0–7.5)
PROTEIN UA: NEGATIVE
Specific Gravity, UA: 1.025 (ref 1.005–1.030)
Urobilinogen, Ur: 2 mg/dL — ABNORMAL HIGH (ref 0.2–1.0)

## 2015-09-05 LAB — MICROSCOPIC EXAMINATION
Bacteria, UA: NONE SEEN
Epithelial Cells (non renal): >10 /hpf — AB (ref 0–10)

## 2015-09-06 LAB — CULTURE, URINE COMPREHENSIVE

## 2015-09-08 ENCOUNTER — Telehealth: Payer: Self-pay

## 2015-09-08 NOTE — Telephone Encounter (Signed)
-----   Message from Harle Battiest, PA-C sent at 09/06/2015  6:38 PM EST ----- Patient's urine culture is negative.  Her CT Urogram and cystoscopy are still pending.

## 2015-09-08 NOTE — Telephone Encounter (Signed)
LMOM

## 2015-09-10 NOTE — Telephone Encounter (Signed)
LMOM

## 2015-09-15 NOTE — Telephone Encounter (Signed)
Spoke with pt in reference to -ucx, CT urogram and cysto. Pt stated she has an appt on Thursday.

## 2015-09-19 ENCOUNTER — Other Ambulatory Visit: Payer: Medicaid Other | Admitting: Urology

## 2015-10-06 ENCOUNTER — Other Ambulatory Visit: Payer: Medicaid Other | Admitting: Urology

## 2015-10-06 ENCOUNTER — Encounter: Payer: Self-pay | Admitting: Urology

## 2015-12-24 ENCOUNTER — Ambulatory Visit: Admission: RE | Admit: 2015-12-24 | Payer: Medicaid Other | Source: Ambulatory Visit

## 2015-12-30 ENCOUNTER — Encounter: Payer: Self-pay | Admitting: Urology

## 2015-12-30 ENCOUNTER — Other Ambulatory Visit: Payer: Medicaid Other | Admitting: Urology

## 2016-02-21 ENCOUNTER — Emergency Department
Admission: EM | Admit: 2016-02-21 | Discharge: 2016-02-21 | Disposition: A | Discharge status: Home / Self Care | Payer: Medicaid Other | Attending: Emergency Medicine | Admitting: Emergency Medicine

## 2016-02-21 DIAGNOSIS — M79605 Pain in left leg: Secondary | ICD-10-CM | POA: Diagnosis not present

## 2016-02-21 DIAGNOSIS — Z87891 Personal history of nicotine dependence: Secondary | ICD-10-CM | POA: Diagnosis not present

## 2016-02-21 MED ORDER — OXYCODONE-ACETAMINOPHEN 5-325 MG PO TABS
1.0000 | ORAL_TABLET | Freq: Once | ORAL | Status: AC
  Administered 2016-02-21: 1 via ORAL
  Filled 2016-02-21: qty 1

## 2016-02-21 MED ORDER — IBUPROFEN 800 MG PO TABS: 800.0000 mg | ORAL_TABLET | Freq: Three times a day (TID) | ORAL | 0 refills | Status: DC | PRN

## 2016-02-21 MED ORDER — CYCLOBENZAPRINE HCL 10 MG PO TABS: 10.0000 mg | ORAL_TABLET | Freq: Three times a day (TID) | ORAL | 0 refills | Status: DC | PRN

## 2016-02-21 NOTE — ED Notes (Signed)
See triage note. 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 Has tried ibuprofen, tylenol, ice and hot packs without relief.

## 2016-02-21 NOTE — ED Provider Notes (Signed)
[facility] Emergency Department Provider Note  ____________________________________________  Time seen: Approximately 1:59 PM  I have reviewed the triage vital signs and the nursing notes.   HISTORY  Chief Complaint Leg Pain    HPI Morgan Daniel is a 30 y.o. female presents for evaluation of left upper thigh pain. Patient states that she has a history of a "tumor" and she is trying to get in [place] for follow-up appointment. Complains of severe pain today with no known trauma.   Past Medical History:  Diagnosis Date  . Anxiety   . Depression   . Seizures [facility])     Patient Active Problem List   Diagnosis Date Noted  . Gross hematuria 09/04/2015  . Left flank pain 09/04/2015    Past Surgical History:  Procedure Laterality Date  . ABDOMINAL HYSTERECTOMY    . FRACTURE SURGERY    . HERNIA REPAIR    . OVARIAN CYST SURGERY      Prior to Admission medications   Medication Sig Start Date End Date Taking? Authorizing Provider  ALPRAZolam Prudy Feeler) 1 MG tablet Take 1 mg by mouth 4 (four) times daily as needed for anxiety.     Historical Provider, MD  cyclobenzaprine (FLEXERIL) 10 MG tablet Take 1 tablet (10 mg total) by mouth 3 (three) times daily as needed for muscle spasms. 02/21/16   Evangeline Dakin, PA-C  DULoxetine HCl (CYMBALTA PO) Take 30 mg by mouth 2 (two) times daily. Reported on 09/04/2015    Historical Provider, MD  gabapentin (NEURONTIN) 300 MG capsule Reported on 09/04/2015 02/28/12   Historical Provider, MD  guaiFENesin-codeine 100-10 MG/5ML syrup Take 10 mLs by mouth every 6 (six) hours as needed for cough. Patient not taking: Reported on 09/04/2015 06/04/15   Payton Mccallum, MD  ibuprofen (ADVIL,MOTRIN) 800 MG tablet Take 1 tablet (800 mg total) by mouth every 8 (eight) hours as needed. 02/21/16   Charmayne Sheer Brendi Mccarroll, PA-C  lidocaine (XYLOCAINE) 2 % solution 20 ml gargle and spit q 6 hours prn Patient not taking: Reported on 09/04/2015 06/04/15    Payton Mccallum, MD  oxyCODONE-acetaminophen (PERCOCET/ROXICET) 5-325 MG tablet Reported on 09/04/2015 06/18/15   Historical Provider, MD  PROVENTIL HFA 108 (90 Base) MCG/ACT inhaler Inhale 2 puffs into the lungs every 4 (four) hours as needed. 08/26/15   Historical Provider, MD  REXULTI 2 MG TABS Take 1 tablet by mouth daily. 06/26/15   Historical Provider, MD  traMADol (ULTRAM) 50 MG tablet Take 50 mg by mouth. Reported on 09/04/2015 08/15/15   Historical Provider, MD  traZODone (DESYREL) 100 MG tablet Take 100 mg by mouth at bedtime as needed. 07/03/15   Historical Provider, MD  Zolpidem Tartrate (AMBIEN PO) Take 10 mg by mouth daily. Reported on 09/04/2015    Historical Provider, MD    Allergies Vicodin [hydrocodone-acetaminophen]  Family History  Problem Relation Age of Onset  . Kidney disease Neg Hx   . Bladder Cancer Neg Hx     Social History Social History  Substance Use Topics  . Smoking status: Former Games developer  . Smokeless tobacco: Not on file     Comment: quit 3 months  . Alcohol use No    Review of Systems Constitutional: No fever/chills Eyes: No visual changes. ENT: No sore throat. Cardiovascular: Denies chest pain. Respiratory: Denies shortness of breath. Gastrointestinal: No abdominal pain.  No nausea, no vomiting.  No diarrhea.  No constipation. Genitourinary: Negative for dysuria. Musculoskeletal: Positive for left upper thigh pain.  Skin: Negative for rash. Neurological: Negative for headaches, focal weakness or numbness.  10-point ROS otherwise negative.  ____________________________________________   PHYSICAL EXAM:  VITAL SIGNS: ED Triage Vitals  Enc Vitals Group     BP 02/21/16 1253 (!) 123/93     Pulse Rate 02/21/16 1253 77     Resp --      Temp 02/21/16 1253 98.3 F (36.8 C)     Temp Source 02/21/16 1253 Oral     SpO2 --      Weight 02/21/16 1253 235 lb (106.6 kg)     Height 02/21/16 1253 5\' 5"  (1.651 m)     Head Circumference --      Peak Flow --       Pain Score 02/21/16 1255 9     Pain Loc --      Pain Edu? --      Excl. in GC? --     Constitutional: Alert and oriented. Well appearing and in no acute distress. Cardiovascular: Normal rate, regular rhythm. Grossly normal heart sounds.  Good peripheral circulation. Respiratory: Normal respiratory effort.  No retractions. Lungs CTAB. Musculoskeletal: No lower extremity tenderness nor edema.  No joint effusions.Left upper inner thigh point tenderness no ecchymosis bruising or nodules felt. Neurologic:  Normal speech and language. No gross focal neurologic deficits are appreciated. No gait instability. Skin:  Skin is warm, dry and intact. No rash noted. Psychiatric: Mood and affect are normal. Speech and behavior are normal.  ____________________________________________   LABS (all labs ordered are listed, but only abnormal results are displayed)  Labs Reviewed - No data to display ____________________________________________   PROCEDURES  Procedure(s) performed: None  Critical Care performed: No  ____________________________________________   INITIAL IMPRESSION / ASSESSMENT AND PLAN / ED COURSE  Pertinent labs & imaging results that were available during my care of the patient were reviewed by me and considered in my medical decision making (see chart for details).    Clinical Course   Nonspecific chronic left upper leg pain. Rx given for ibuprofen and Flexeril. Patient follow-up with [facility] for further evaluation and workup. ____________________________________________   FINAL CLINICAL IMPRESSION(S) / ED DIAGNOSES  Final diagnoses:  Left leg pain     This chart was dictated using voice recognition software/Dragon. Despite best efforts to proofread, errors can occur which can change the meaning. Any change was purely unintentional.    Evangeline Dakin, PA-C 02/21/16 1511    Nita Sickle, MD 02/21/16 [phone]
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
## 2016-04-10 ENCOUNTER — Encounter: Payer: Self-pay | Admitting: *Deleted

## 2016-04-10 ENCOUNTER — Emergency Department
Admission: EM | Admit: 2016-04-10 | Discharge: 2016-04-10 | Disposition: A | Discharge status: Home / Self Care | Payer: Medicaid Other | Attending: Emergency Medicine | Admitting: Emergency Medicine

## 2016-04-10 DIAGNOSIS — Z0471 Encounter for examination and observation following alleged adult physical abuse: Secondary | ICD-10-CM | POA: Insufficient documentation

## 2016-04-10 DIAGNOSIS — Y929 Unspecified place or not applicable: Secondary | ICD-10-CM | POA: Insufficient documentation

## 2016-04-10 DIAGNOSIS — Y939 Activity, unspecified: Secondary | ICD-10-CM | POA: Diagnosis not present

## 2016-04-10 DIAGNOSIS — Y999 Unspecified external cause status: Secondary | ICD-10-CM | POA: Diagnosis not present

## 2016-04-10 DIAGNOSIS — Z5321 Procedure and treatment not carried out due to patient leaving prior to being seen by health care provider: Secondary | ICD-10-CM | POA: Insufficient documentation

## 2016-04-10 DIAGNOSIS — Z87891 Personal history of nicotine dependence: Secondary | ICD-10-CM | POA: Diagnosis not present

## 2016-04-10 MED ORDER — IBUPROFEN 400 MG PO TABS
ORAL_TABLET | ORAL | Status: AC
  Filled 2016-04-10: qty 1

## 2016-04-10 MED ORDER — IBUPROFEN 400 MG PO TABS
400.0000 mg | ORAL_TABLET | Freq: Once | ORAL | Status: AC | PRN
  Administered 2016-04-10: 400 mg via ORAL

## 2016-04-10 NOTE — ED Notes (Signed)
Pt to STAT desk and announced that she was hurting and tired and would just come back in the morning when she could be seen faster.
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
## 2016-05-24 ENCOUNTER — Emergency Department
Admission: EM | Admit: 2016-05-24 | Discharge: 2016-05-24 | Disposition: A | Discharge status: Home / Self Care | Payer: Medicaid Other | Attending: Emergency Medicine | Admitting: Emergency Medicine

## 2016-05-24 DIAGNOSIS — H1089 Other conjunctivitis: Secondary | ICD-10-CM | POA: Insufficient documentation

## 2016-05-24 DIAGNOSIS — Z79899 Other long term (current) drug therapy: Secondary | ICD-10-CM | POA: Diagnosis not present

## 2016-05-24 DIAGNOSIS — H1031 Unspecified acute conjunctivitis, right eye: Secondary | ICD-10-CM

## 2016-05-24 DIAGNOSIS — Z791 Long term (current) use of non-steroidal anti-inflammatories (NSAID): Secondary | ICD-10-CM | POA: Insufficient documentation

## 2016-05-24 DIAGNOSIS — H578 Other specified disorders of eye and adnexa: Secondary | ICD-10-CM | POA: Diagnosis present

## 2016-05-24 DIAGNOSIS — Z87891 Personal history of nicotine dependence: Secondary | ICD-10-CM | POA: Insufficient documentation

## 2016-05-24 MED ORDER — CIPROFLOXACIN HCL 0.3 % OP SOLN
2.0000 [drp] | Freq: Once | OPHTHALMIC | Status: AC
  Administered 2016-05-24: 2 [drp] via OPHTHALMIC
  Filled 2016-05-24: qty 2.5

## 2016-05-24 MED ORDER — CIPROFLOXACIN HCL 0.3 % OP SOLN: 1.0000 [drp] | OPHTHALMIC | 0 refills | Status: AC

## 2016-05-24 NOTE — ED Provider Notes (Signed)
[facility] Emergency Department Provider Note  ____________________________________________  Time seen: Approximately 8:42 PM  I have reviewed the triage vital signs and the nursing notes.   HISTORY  Chief Complaint Eye Drainage    HPI Morgan Daniel is a 30 y.o. female who presents emergency department complaining of right eye redness, purulent drainage, burning sensation. Patient states that she does wear contacts but has removed her contacts. No direct trauma to the eye. No visual changes. Patient reports that this morning she noticed that her eye was red and had a burning sensation. She per contact in and then noticed purulent drainage throughout the day. She is removed her contact at this time. No medications prior to arrival.   Past Medical History:  Diagnosis Date  . Anxiety   . Depression   . Seizures [facility](HCC)     Patient Active Problem List   Diagnosis Date Noted  . Gross hematuria 09/04/2015  . Left flank pain 09/04/2015    Past Surgical History:  Procedure Laterality Date  . ABDOMINAL HYSTERECTOMY    . FRACTURE SURGERY    . HERNIA REPAIR    . OVARIAN CYST SURGERY      Prior to Admission medications   Medication Sig Start Date End Date Taking? Authorizing Provider  ALPRAZolam Prudy Feeler(XANAX) 1 MG tablet Take 1 mg by mouth 4 (four) times daily as needed for anxiety.     Historical Provider, MD  ciprofloxacin (CILOXAN) 0.3 % ophthalmic solution Place 1 drop into the right eye every 2 (two) hours. Administer 1 drop, every 2 hours, while awake, for 2 days. Then 1 drop, every 4 hours, while awake, for the next 5 days. 05/24/16 05/29/16  Christiane HaJonathan D Leanore Biggers, PA-C  cyclobenzaprine (FLEXERIL) 10 MG tablet Take 1 tablet (10 mg total) by mouth 3 (three) times daily as needed for muscle spasms. 02/21/16   Evangeline Dakinharles M Beers, PA-C  DULoxetine HCl (CYMBALTA PO) Take 30 mg by mouth 2 (two) times daily. Reported on 09/04/2015    Historical Provider, MD  gabapentin  (NEURONTIN) 300 MG capsule Reported on 09/04/2015 02/28/12   Historical Provider, MD  guaiFENesin-codeine 100-10 MG/5ML syrup Take 10 mLs by mouth every 6 (six) hours as needed for cough. Patient not taking: Reported on 09/04/2015 06/04/15   Payton Mccallumrlando Conty, MD  ibuprofen (ADVIL,MOTRIN) 800 MG tablet Take 1 tablet (800 mg total) by mouth every 8 (eight) hours as needed. 02/21/16   Charmayne Sheerharles M Beers, PA-C  lidocaine (XYLOCAINE) 2 % solution 20 ml gargle and spit q 6 hours prn Patient not taking: Reported on 09/04/2015 06/04/15   Payton Mccallumrlando Conty, MD  oxyCODONE-acetaminophen (PERCOCET/ROXICET) 5-325 MG tablet Reported on 09/04/2015 06/18/15   Historical Provider, MD  PROVENTIL HFA 108 (90 Base) MCG/ACT inhaler Inhale 2 puffs into the lungs every 4 (four) hours as needed. 08/26/15   Historical Provider, MD  REXULTI 2 MG TABS Take 1 tablet by mouth daily. 06/26/15   Historical Provider, MD  traMADol (ULTRAM) 50 MG tablet Take 50 mg by mouth. Reported on 09/04/2015 08/15/15   Historical Provider, MD  traZODone (DESYREL) 100 MG tablet Take 100 mg by mouth at bedtime as needed. 07/03/15   Historical Provider, MD  Zolpidem Tartrate (AMBIEN PO) Take 10 mg by mouth daily. Reported on 09/04/2015    Historical Provider, MD    Allergies Vicodin [hydrocodone-acetaminophen]  Family History  Problem Relation Age of Onset  . Kidney disease Neg Hx   . Bladder Cancer Neg Hx  Social History Social History  Substance Use Topics  . Smoking status: Former Games developermoker  . Smokeless tobacco: Never Used     Comment: quit 3 months  . Alcohol use No     Review of Systems  Constitutional: No fever/chills Eyes: Positive for right eye irritation. Positive for right eye redness. Positive for PE on drainage of right eye. ENT: No upper respiratory complaints. Cardiovascular: no chest pain. Respiratory: no cough. No SOB. Musculoskeletal: Negative for musculoskeletal pain. Skin: Negative for rash, abrasions, lacerations,  ecchymosis. Neurological: Negative for headaches, focal weakness or numbness. 10-point ROS otherwise negative.  ____________________________________________   PHYSICAL EXAM:  VITAL SIGNS: ED Triage Vitals  Enc Vitals Group     BP 05/24/16 2015 (!) 113/49     Pulse Rate 05/24/16 2015 72     Resp 05/24/16 2015 16     Temp 05/24/16 2015 98.2 F (36.8 C)     Temp Source 05/24/16 2015 Oral     SpO2 05/24/16 2015 99 %     Weight 05/24/16 2016 250 lb (113.4 kg)     Height 05/24/16 2016 5\' 5"  (1.651 m)     Head Circumference --      Peak Flow --      Pain Score 05/24/16 2019 7     Pain Loc --      Pain Edu? --      Excl. in GC? --      Constitutional: Alert and oriented. Well appearing and in no acute distress. Eyes: Conjunctivae on right is erythematous. Purulent drainage is noted to the right lower eyelid. Funduscopic exam is unremarkable. PERRL. EOMI. Head: Atraumatic. ENT:      Ears:       Nose: No congestion/rhinnorhea.      Mouth/Throat: Mucous membranes are moist.  Neck: No stridor.   Cardiovascular: Normal rate, regular rhythm. Normal S1 and S2.  Good peripheral circulation. Respiratory: Normal respiratory effort without tachypnea or retractions. Lungs CTAB. Good air entry to the bases with no decreased or absent breath sounds. Musculoskeletal: Full range of motion to all extremities. No gross deformities appreciated. Neurologic:  Normal speech and language. No gross focal neurologic deficits are appreciated.  Skin:  Skin is warm, dry and intact. No rash noted. Psychiatric: Mood and affect are normal. Speech and behavior are normal. Patient exhibits appropriate insight and judgement.   ____________________________________________   LABS (all labs ordered are listed, but only abnormal results are displayed)  Labs Reviewed - No data to display ____________________________________________  EKG   ____________________________________________  RADIOLOGY   No  results found.  ____________________________________________    PROCEDURES  Procedure(s) performed:    Procedures    Medications  ciprofloxacin (CILOXAN) 0.3 % ophthalmic solution 2 drop (not administered)     ____________________________________________   INITIAL IMPRESSION / ASSESSMENT AND PLAN / ED COURSE  Pertinent labs & imaging results that were available during my care of the patient were reviewed by me and considered in my medical decision making (see chart for details).  Review of the [place] CSRS was performed in accordance of the [facility] prior to dispensing any controlled drugs.  Clinical Course    Patient's diagnosis is consistent with bacterial conjunctivitis of the right eye.. Patient will be discharged home with prescriptions for antibiotic eyedrops. Patient is to follow up with ophthalmology as needed or otherwise directed. Patient is given ED precautions to return to the ED for any worsening or new symptoms.     ____________________________________________  FINAL CLINICAL  IMPRESSION(S) / ED DIAGNOSES  Final diagnoses:  Acute bacterial conjunctivitis of right eye      NEW MEDICATIONS STARTED DURING THIS VISIT:  New Prescriptions   CIPROFLOXACIN (CILOXAN) 0.3 % OPHTHALMIC SOLUTION    Place 1 drop into the right eye every 2 (two) hours. Administer 1 drop, every 2 hours, while awake, for 2 days. Then 1 drop, every 4 hours, while awake, for the next 5 days.        This chart was dictated using voice recognition software/Dragon. Despite best efforts to proofread, errors can occur which can change the meaning. Any change was purely unintentional.    Racheal Patches, PA-C 05/24/16 2050    Sharman Cheek, MD 05/24/16 2351

## 2016-05-24 NOTE — ED Triage Notes (Signed)
Pt reports pain and drainage to R eye.  No known injury.  Pt reports being around several young children recently, but unknown if any had pink eye.

## 2016-05-24 NOTE — ED Notes (Signed)
Pt discharged to home.  Family member driving.  Discharge instructions reviewed.  Verbalized understanding.  No questions or concerns at this time.  Teach back verified.  Pt in NAD.  No items left in ED.   

## 2016-09-03 ENCOUNTER — Emergency Department
Admission: EM | Admit: 2016-09-03 | Discharge: 2016-09-03 | Disposition: A | Discharge status: Home / Self Care | Payer: Medicaid Other | Attending: Emergency Medicine | Admitting: Emergency Medicine

## 2016-09-03 ENCOUNTER — Encounter: Payer: Self-pay | Admitting: Emergency Medicine

## 2016-09-03 DIAGNOSIS — R69 Illness, unspecified: Secondary | ICD-10-CM

## 2016-09-03 DIAGNOSIS — R509 Fever, unspecified: Secondary | ICD-10-CM | POA: Diagnosis not present

## 2016-09-03 DIAGNOSIS — R0981 Nasal congestion: Secondary | ICD-10-CM | POA: Insufficient documentation

## 2016-09-03 DIAGNOSIS — R05 Cough: Secondary | ICD-10-CM | POA: Insufficient documentation

## 2016-09-03 DIAGNOSIS — M791 Myalgia: Secondary | ICD-10-CM | POA: Insufficient documentation

## 2016-09-03 DIAGNOSIS — Z87891 Personal history of nicotine dependence: Secondary | ICD-10-CM | POA: Diagnosis not present

## 2016-09-03 DIAGNOSIS — J111 Influenza due to unidentified influenza virus with other respiratory manifestations: Secondary | ICD-10-CM

## 2016-09-03 MED ORDER — BENZONATATE 100 MG PO CAPS: 200.0000 mg | ORAL_CAPSULE | Freq: Three times a day (TID) | ORAL | 0 refills | Status: AC | PRN

## 2016-09-03 MED ORDER — OSELTAMIVIR PHOSPHATE 75 MG PO CAPS: 75.0000 mg | ORAL_CAPSULE | Freq: Two times a day (BID) | ORAL | 0 refills | Status: AC

## 2016-09-03 NOTE — ED Provider Notes (Signed)
[facility] Emergency Department Provider Note   ____________________________________________   First MD Initiated Contact with Patient 09/03/16 1308     (approximate)  I have reviewed the triage vital signs and the nursing notes.   HISTORY  Chief Complaint Generalized Body Aches and Cough   HPI Morgan Daniel is a 31 y.o. female is here complaining of generalized body aches, cough, congestion. Patient states that onset was sudden with muscle aches and fever. Patient did not receive the flu vaccine this year. Patient denies any other health problems. Patient is symptomatic and agrees that influenza testing is not necessary. She rates her pain as 10 over 10.   Past Medical History:  Diagnosis Date  . Anxiety   . Depression   . Seizures [facility])     Patient Active Problem List   Diagnosis Date Noted  . Gross hematuria 09/04/2015  . Left flank pain 09/04/2015    Past Surgical History:  Procedure Laterality Date  . ABDOMINAL HYSTERECTOMY    . FRACTURE SURGERY    . HERNIA REPAIR    . OVARIAN CYST SURGERY      Prior to Admission medications   Medication Sig Start Date End Date Taking? Authorizing Provider  ALPRAZolam Prudy Feeler) 1 MG tablet Take 1 mg by mouth 4 (four) times daily as needed for anxiety.     Historical Provider, MD  benzonatate (TESSALON PERLES) 100 MG capsule Take 2 capsules (200 mg total) by mouth 3 (three) times daily as needed. 09/03/16 09/03/17  Tommi Rumps, PA-C  cyclobenzaprine (FLEXERIL) 10 MG tablet Take 1 tablet (10 mg total) by mouth 3 (three) times daily as needed for muscle spasms. 02/21/16   Evangeline Dakin, PA-C  DULoxetine HCl (CYMBALTA PO) Take 30 mg by mouth 2 (two) times daily. Reported on 09/04/2015    Historical Provider, MD  gabapentin (NEURONTIN) 300 MG capsule Reported on 09/04/2015 02/28/12   Historical Provider, MD  guaiFENesin-codeine 100-10 MG/5ML syrup Take 10 mLs by mouth every 6 (six) hours as needed for  cough. Patient not taking: Reported on 09/04/2015 06/04/15   Payton Mccallum, MD  ibuprofen (ADVIL,MOTRIN) 800 MG tablet Take 1 tablet (800 mg total) by mouth every 8 (eight) hours as needed. 02/21/16   Charmayne Sheer Beers, PA-C  lidocaine (XYLOCAINE) 2 % solution 20 ml gargle and spit q 6 hours prn Patient not taking: Reported on 09/04/2015 06/04/15   Payton Mccallum, MD  oseltamivir (TAMIFLU) 75 MG capsule Take 1 capsule (75 mg total) by mouth 2 (two) times daily. 09/03/16 09/08/16  Tommi Rumps, PA-C  oxyCODONE-acetaminophen (PERCOCET/ROXICET) 5-325 MG tablet Reported on 09/04/2015 06/18/15   Historical Provider, MD  PROVENTIL HFA 108 (90 Base) MCG/ACT inhaler Inhale 2 puffs into the lungs every 4 (four) hours as needed. 08/26/15   Historical Provider, MD  REXULTI 2 MG TABS Take 1 tablet by mouth daily. 06/26/15   Historical Provider, MD  traMADol (ULTRAM) 50 MG tablet Take 50 mg by mouth. Reported on 09/04/2015 08/15/15   Historical Provider, MD  traZODone (DESYREL) 100 MG tablet Take 100 mg by mouth at bedtime as needed. 07/03/15   Historical Provider, MD  Zolpidem Tartrate (AMBIEN PO) Take 10 mg by mouth daily. Reported on 09/04/2015    Historical Provider, MD    Allergies Vicodin [hydrocodone-acetaminophen]  Family History  Problem Relation Age of Onset  . Kidney disease Neg Hx   . Bladder Cancer Neg Hx     Social History Social History  Substance Use Topics  . Smoking status: Former Games developer  . Smokeless tobacco: Never Used     Comment: quit 3 months  . Alcohol use No    Review of Systems Constitutional: Positive fever/chills Eyes: No visual changes. ENT: No sore throat. Cardiovascular: Denies chest pain. Respiratory: Denies shortness of breath. Positive cough. Gastrointestinal: No abdominal pain.  No nausea, no vomiting.  No diarrhea.   Musculoskeletal: Positive for generalized body aches. Skin: Negative for rash. Neurological: Negative for headaches, focal weakness or numbness.  10-point  ROS otherwise negative.  ____________________________________________   PHYSICAL EXAM:  VITAL SIGNS: ED Triage Vitals [09/03/16 1136]  Enc Vitals Group     BP (!) 104/57     Pulse Rate (!) 106     Resp 20     Temp 98.3 F (36.8 C)     Temp Source Oral     SpO2 100 %     Weight 250 lb (113.4 kg)     Height 5\' 5"  (1.651 m)     Head Circumference      Peak Flow      Pain Score 10     Pain Loc      Pain Edu?      Excl. in GC?     Constitutional: Alert and oriented. Well appearing and in no acute distress. Eyes: Conjunctivae are normal. PERRL. EOMI. Head: Atraumatic. Nose: No congestion/rhinnorhea.  TMs are dull bilaterally. Mouth/Throat: Mucous membranes are moist.  Oropharynx non-erythematous. Neck: No stridor.   Hematological/Lymphatic/Immunilogical: No cervical lymphadenopathy. Cardiovascular: Normal rate, regular rhythm. Grossly normal heart sounds.  Good peripheral circulation. Respiratory: Normal respiratory effort.  No retractions. Lungs CTAB. Gastrointestinal: Soft and nontender. No distention.  Musculoskeletal: His upper and lower extremities without any difficulty. Normal gait was noted. Neurologic:  Normal speech and language. No gross focal neurologic deficits are appreciated. No gait instability. Skin:  Skin is warm, dry and intact. No rash noted. Psychiatric: Mood and affect are normal. Speech and behavior are normal.  ____________________________________________   LABS (all labs ordered are listed, but only abnormal results are displayed)  Labs Reviewed - No data to display  PROCEDURES  Procedure(s) performed: None  Procedures  Critical Care performed: No  ____________________________________________   INITIAL IMPRESSION / ASSESSMENT AND PLAN / ED COURSE  Pertinent labs & imaging results that were available during my care of the patient were reviewed by me and considered in my medical decision making (see chart for details).  Patient was  treated based on her sudden onset of symptoms. Patient was given a prescription for Tessalon Perles to every 8 hours as needed for cough. She is also given a prescription for Tamiflu 75 mg twice a day for 5 days. She is to follow-up with her primary care doctor if any continued problems. She is encouraged to drink lots of fluids and continue Tylenol or ibuprofen as needed for fever, body aches or headache.      ____________________________________________   FINAL CLINICAL IMPRESSION(S) / ED DIAGNOSES  Final diagnoses:  Influenza-like illness      NEW MEDICATIONS STARTED DURING THIS VISIT:  Discharge Medication List as of 09/03/2016  1:24 PM    START taking these medications   Details  benzonatate (TESSALON PERLES) 100 MG capsule Take 2 capsules (200 mg total) by mouth 3 (three) times daily as needed., Starting Fri 09/03/2016, Until Sat 09/03/2017, Print    oseltamivir (TAMIFLU) 75 MG capsule Take 1 capsule (75 mg total) by mouth 2 (two) times  daily., Starting Fri 09/03/2016, Until Wed 09/08/2016, Print         Note:  This document was prepared using Dragon voice recognition software and may include unintentional dictation errors.    Tommi RumpsRhonda L Irys Nigh, PA-C 09/03/16 1621    Myrna Blazeravid Matthew Schaevitz, MD 09/04/16 [phone]

## 2016-09-03 NOTE — Discharge Instructions (Signed)
Increase fluids. Tylenol or ibuprofen as needed for fever, body aches, headache. Begin taking Tamiflu twice a day for 5 days. Tessalon Perles 2 every 8 hours as needed for cough. Follow-up with your primary care doctor if any continued problems.

## 2016-09-03 NOTE — ED Triage Notes (Signed)
Pt comes into the ED via POV c/o generalized body aches, congestion, cough.  Patient presents in NAD at this time with even and unlabored respirations.  Patient ambulatory to room at this time with no distress noted.

## 2016-11-08 ENCOUNTER — Other Ambulatory Visit: Payer: Self-pay | Admitting: Family Medicine

## 2016-11-08 DIAGNOSIS — N644 Mastodynia: Secondary | ICD-10-CM

## 2016-11-18 ENCOUNTER — Ambulatory Visit
Admission: RE | Admit: 2016-11-18 | Discharge: 2016-11-18 | Disposition: A | Discharge status: Home / Self Care | Payer: Medicaid Other | Source: Ambulatory Visit | Attending: Family Medicine | Admitting: Family Medicine

## 2016-11-18 DIAGNOSIS — N6001 Solitary cyst of right breast: Secondary | ICD-10-CM | POA: Insufficient documentation

## 2016-11-18 DIAGNOSIS — N644 Mastodynia: Secondary | ICD-10-CM | POA: Insufficient documentation

## 2016-12-13 ENCOUNTER — Other Ambulatory Visit: Payer: Self-pay | Admitting: Family Medicine

## 2016-12-13 DIAGNOSIS — R1011 Right upper quadrant pain: Secondary | ICD-10-CM

## 2016-12-16 ENCOUNTER — Ambulatory Visit
Admission: RE | Admit: 2016-12-16 | Discharge: 2016-12-16 | Disposition: A | Discharge status: Home / Self Care | Payer: Medicaid Other | Source: Ambulatory Visit | Attending: Family Medicine | Admitting: Family Medicine

## 2016-12-16 DIAGNOSIS — R1011 Right upper quadrant pain: Secondary | ICD-10-CM | POA: Insufficient documentation

## 2016-12-16 DIAGNOSIS — K760 Fatty (change of) liver, not elsewhere classified: Secondary | ICD-10-CM | POA: Diagnosis not present

## 2017-04-25 ENCOUNTER — Emergency Department: Payer: No Typology Code available for payment source

## 2017-04-25 ENCOUNTER — Encounter: Payer: Self-pay | Admitting: Emergency Medicine

## 2017-04-25 ENCOUNTER — Emergency Department
Admission: EM | Admit: 2017-04-25 | Discharge: 2017-04-25 | Disposition: A | Discharge status: Other Acute Inpatient Hospital | Payer: No Typology Code available for payment source | Attending: Emergency Medicine | Admitting: Emergency Medicine

## 2017-04-25 DIAGNOSIS — R51 Headache: Secondary | ICD-10-CM | POA: Insufficient documentation

## 2017-04-25 DIAGNOSIS — Z79899 Other long term (current) drug therapy: Secondary | ICD-10-CM | POA: Diagnosis not present

## 2017-04-25 DIAGNOSIS — S22000A Wedge compression fracture of unspecified thoracic vertebra, initial encounter for closed fracture: Secondary | ICD-10-CM

## 2017-04-25 DIAGNOSIS — S299XXA Unspecified injury of thorax, initial encounter: Secondary | ICD-10-CM | POA: Diagnosis present

## 2017-04-25 DIAGNOSIS — R109 Unspecified abdominal pain: Secondary | ICD-10-CM | POA: Insufficient documentation

## 2017-04-25 DIAGNOSIS — Y9389 Activity, other specified: Secondary | ICD-10-CM | POA: Diagnosis not present

## 2017-04-25 DIAGNOSIS — S29011A Strain of muscle and tendon of front wall of thorax, initial encounter: Secondary | ICD-10-CM

## 2017-04-25 DIAGNOSIS — Z87891 Personal history of nicotine dependence: Secondary | ICD-10-CM | POA: Insufficient documentation

## 2017-04-25 DIAGNOSIS — Y92410 Unspecified street and highway as the place of occurrence of the external cause: Secondary | ICD-10-CM | POA: Diagnosis not present

## 2017-04-25 DIAGNOSIS — Y999 Unspecified external cause status: Secondary | ICD-10-CM | POA: Insufficient documentation

## 2017-04-25 DIAGNOSIS — R079 Chest pain, unspecified: Secondary | ICD-10-CM | POA: Insufficient documentation

## 2017-04-25 LAB — URINE DRUG SCREEN, QUALITATIVE (ARMC ONLY)
AMPHETAMINES, UR SCREEN: NOT DETECTED
BARBITURATES, UR SCREEN: NOT DETECTED
BENZODIAZEPINE, UR SCRN: NOT DETECTED
CANNABINOID 50 NG, UR ~~LOC~~: POSITIVE — AB
Cocaine Metabolite,Ur ~~LOC~~: NOT DETECTED
MDMA (Ecstasy)Ur Screen: NOT DETECTED
Methadone Scn, Ur: NOT DETECTED
OPIATE, UR SCREEN: NOT DETECTED
PHENCYCLIDINE (PCP) UR S: NOT DETECTED
Tricyclic, Ur Screen: NOT DETECTED

## 2017-04-25 LAB — URINALYSIS, COMPLETE (UACMP) WITH MICROSCOPIC
BILIRUBIN URINE: NEGATIVE
Glucose, UA: NEGATIVE mg/dL
KETONES UR: NEGATIVE mg/dL
Nitrite: POSITIVE — AB
PH: 7 (ref 5.0–8.0)
PROTEIN: NEGATIVE mg/dL
Specific Gravity, Urine: >1.046 — ABNORMAL HIGH (ref 1.005–1.030)

## 2017-04-25 LAB — CBC WITH DIFFERENTIAL/PLATELET
BASOS PCT: 1 %
Basophils Absolute: 0.1 10*3/uL (ref 0–0.1)
Eosinophils Absolute: 0 10*3/uL (ref 0–0.7)
Eosinophils Relative: 0 %
HEMATOCRIT: 41.5 % (ref 35.0–47.0)
Hemoglobin: 13.9 g/dL (ref 12.0–16.0)
LYMPHS ABS: 1.2 10*3/uL (ref 1.0–3.6)
Lymphocytes Relative: 4 %
MCH: 28.9 pg (ref 26.0–34.0)
MCHC: 33.5 g/dL (ref 32.0–36.0)
MCV: 86.4 fL (ref 80.0–100.0)
MONO ABS: 2 10*3/uL — AB (ref 0.2–0.9)
MONOS PCT: 6 %
NEUTROS ABS: 27.5 10*3/uL — AB (ref 1.4–6.5)
Neutrophils Relative %: 89 %
Platelets: 245 10*3/uL (ref 150–440)
RBC: 4.8 MIL/uL (ref 3.80–5.20)
RDW: 13.3 % (ref 11.5–14.5)
WBC: 30.9 10*3/uL — ABNORMAL HIGH (ref 3.6–11.0)

## 2017-04-25 LAB — COMPREHENSIVE METABOLIC PANEL
ALK PHOS: 85 U/L (ref 38–126)
ALT: 45 U/L (ref 14–54)
AST: 38 U/L (ref 15–41)
Albumin: 4.3 g/dL (ref 3.5–5.0)
Anion gap: 9 (ref 5–15)
BUN: 13 mg/dL (ref 6–20)
CALCIUM: 9.4 mg/dL (ref 8.9–10.3)
CO2: 23 mmol/L (ref 22–32)
CREATININE: 0.66 mg/dL (ref 0.44–1.00)
Chloride: 105 mmol/L (ref 101–111)
Glucose, Bld: 115 mg/dL — ABNORMAL HIGH (ref 65–99)
Potassium: 3.7 mmol/L (ref 3.5–5.1)
Sodium: 137 mmol/L (ref 135–145)
Total Bilirubin: 1 mg/dL (ref 0.3–1.2)
Total Protein: 7.7 g/dL (ref 6.5–8.1)

## 2017-04-25 LAB — PROTIME-INR
INR: 1.03
Prothrombin Time: 13.4 seconds (ref 11.4–15.2)

## 2017-04-25 MED ORDER — HYDROMORPHONE HCL 1 MG/ML IJ SOLN
1.0000 mg | Freq: Once | INTRAMUSCULAR | Status: AC
  Administered 2017-04-25: 1 mg via INTRAVENOUS

## 2017-04-25 MED ORDER — HYDROMORPHONE HCL 1 MG/ML IJ SOLN
INTRAMUSCULAR | Status: AC
  Filled 2017-04-25: qty 1

## 2017-04-25 MED ORDER — FENTANYL CITRATE (PF) 100 MCG/2ML IJ SOLN
50.0000 ug | Freq: Once | INTRAMUSCULAR | Status: AC
  Administered 2017-04-25: 50 ug via INTRAVENOUS

## 2017-04-25 MED ORDER — HYDROMORPHONE HCL 1 MG/ML IJ SOLN
INTRAMUSCULAR | Status: AC
  Administered 2017-04-25: 1 mg via INTRAVENOUS
  Filled 2017-04-25: qty 1

## 2017-04-25 MED ORDER — FENTANYL CITRATE (PF) 100 MCG/2ML IJ SOLN
INTRAMUSCULAR | Status: AC
  Administered 2017-04-25: 50 ug via INTRAVENOUS
  Filled 2017-04-25: qty 2

## 2017-04-25 MED ORDER — IOPAMIDOL (ISOVUE-370) INJECTION 76%
100.0000 mL | Freq: Once | INTRAVENOUS | Status: AC | PRN
  Administered 2017-04-25: 100 mL via INTRAVENOUS

## 2017-04-25 MED ORDER — FENTANYL CITRATE (PF) 100 MCG/2ML IJ SOLN
50.0000 ug | Freq: Once | INTRAMUSCULAR | Status: AC
Start: 2017-04-25 — End: 2017-04-25
  Administered 2017-04-25: 50 ug via INTRAVENOUS

## 2017-04-25 NOTE — ED Notes (Signed)
Tx to [facility] with EMS. VSS.

## 2017-04-25 NOTE — ED Notes (Signed)
EMTALA reviewed. 
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
## 2017-04-25 NOTE — ED Notes (Signed)
Report to [facility].

## 2017-04-25 NOTE — ED Notes (Signed)
XRAY  POWERSHARE  WITH  [facility] 
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
## 2017-04-28 LAB — URINE CULTURE

## 2017-05-03 ENCOUNTER — Emergency Department
Admission: EM | Admit: 2017-05-03 | Discharge: 2017-05-03 | Disposition: A | Discharge status: Home / Self Care | Payer: No Typology Code available for payment source | Attending: Emergency Medicine | Admitting: Emergency Medicine

## 2017-05-03 ENCOUNTER — Encounter: Payer: Self-pay | Admitting: Medical Oncology

## 2017-05-03 ENCOUNTER — Emergency Department: Payer: No Typology Code available for payment source

## 2017-05-03 DIAGNOSIS — Z87891 Personal history of nicotine dependence: Secondary | ICD-10-CM | POA: Insufficient documentation

## 2017-05-03 DIAGNOSIS — S22000D Wedge compression fracture of unspecified thoracic vertebra, subsequent encounter for fracture with routine healing: Secondary | ICD-10-CM | POA: Insufficient documentation

## 2017-05-03 DIAGNOSIS — S22000A Wedge compression fracture of unspecified thoracic vertebra, initial encounter for closed fracture: Secondary | ICD-10-CM

## 2017-05-03 DIAGNOSIS — S20212D Contusion of left front wall of thorax, subsequent encounter: Secondary | ICD-10-CM | POA: Diagnosis not present

## 2017-05-03 DIAGNOSIS — N644 Mastodynia: Secondary | ICD-10-CM | POA: Diagnosis present

## 2017-05-03 DIAGNOSIS — S29011D Strain of muscle and tendon of front wall of thorax, subsequent encounter: Secondary | ICD-10-CM | POA: Insufficient documentation

## 2017-05-03 DIAGNOSIS — Z79899 Other long term (current) drug therapy: Secondary | ICD-10-CM | POA: Diagnosis not present

## 2017-05-03 DIAGNOSIS — S29011S Strain of muscle and tendon of front wall of thorax, sequela: Secondary | ICD-10-CM

## 2017-05-03 MED ORDER — OXYCODONE-ACETAMINOPHEN 5-325 MG PO TABS
1.0000 | ORAL_TABLET | Freq: Once | ORAL | Status: AC
  Administered 2017-05-03: 1 via ORAL
  Filled 2017-05-03: qty 1
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
## 2017-05-03 NOTE — ED Provider Notes (Signed)
ED ECG REPORT I, Micheal Sheen, the attending physician, personally viewed and interpreted this ECG.  Date: 05/03/2017 EKG Time: 16:51 Rate: 63 Rhythm: normal sinus rhythm QRS Axis: normal Intervals: normal ST/T Wave abnormalities: normal Narrative Interpretation: no evidence of acute ischemia    Loleta Rose, MD 05/03/17 1700
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
## 2017-11-16 ENCOUNTER — Encounter: Payer: Self-pay | Admitting: Emergency Medicine

## 2017-11-16 ENCOUNTER — Other Ambulatory Visit: Payer: Self-pay

## 2017-11-16 ENCOUNTER — Emergency Department
Admission: EM | Admit: 2017-11-16 | Discharge: 2017-11-16 | Disposition: A | Discharge status: Home / Self Care | Payer: Medicaid Other | Attending: Emergency Medicine | Admitting: Emergency Medicine

## 2017-11-16 ENCOUNTER — Emergency Department: Payer: Medicaid Other

## 2017-11-16 DIAGNOSIS — Z87891 Personal history of nicotine dependence: Secondary | ICD-10-CM | POA: Diagnosis not present

## 2017-11-16 DIAGNOSIS — Z87442 Personal history of urinary calculi: Secondary | ICD-10-CM | POA: Diagnosis not present

## 2017-11-16 DIAGNOSIS — Z79899 Other long term (current) drug therapy: Secondary | ICD-10-CM | POA: Diagnosis not present

## 2017-11-16 DIAGNOSIS — N12 Tubulo-interstitial nephritis, not specified as acute or chronic: Secondary | ICD-10-CM | POA: Diagnosis not present

## 2017-11-16 DIAGNOSIS — R109 Unspecified abdominal pain: Secondary | ICD-10-CM | POA: Diagnosis present

## 2017-11-16 LAB — URINALYSIS, COMPLETE (UACMP) WITH MICROSCOPIC
BILIRUBIN URINE: NEGATIVE
Glucose, UA: NEGATIVE mg/dL
KETONES UR: NEGATIVE mg/dL
Nitrite: NEGATIVE
PROTEIN: 100 mg/dL — AB
SQUAMOUS EPITHELIAL / LPF: NONE SEEN (ref 0–5)
Specific Gravity, Urine: 1.021 (ref 1.005–1.030)
pH: 5 (ref 5.0–8.0)

## 2017-11-16 LAB — BASIC METABOLIC PANEL
Anion gap: 6 (ref 5–15)
BUN: 15 mg/dL (ref 6–20)
CALCIUM: 9.7 mg/dL (ref 8.9–10.3)
CO2: 24 mmol/L (ref 22–32)
CREATININE: 0.63 mg/dL (ref 0.44–1.00)
Chloride: 110 mmol/L (ref 101–111)
GFR calc Af Amer: >60 mL/min (ref >60)
Glucose, Bld: 92 mg/dL (ref 65–99)
Potassium: 3.6 mmol/L (ref 3.5–5.1)
Sodium: 140 mmol/L (ref 135–145)

## 2017-11-16 LAB — CBC
HCT: 41.9 % (ref 35.0–47.0)
Hemoglobin: 14.3 g/dL (ref 12.0–16.0)
MCH: 30 pg (ref 26.0–34.0)
MCHC: 34.1 g/dL (ref 32.0–36.0)
MCV: 88 fL (ref 80.0–100.0)
PLATELETS: 266 10*3/uL (ref 150–440)
RBC: 4.77 MIL/uL (ref 3.80–5.20)
RDW: 13.7 % (ref 11.5–14.5)
WBC: 14.1 10*3/uL — AB (ref 3.6–11.0)

## 2017-11-16 MED ORDER — ONDANSETRON HCL 4 MG/2ML IJ SOLN
4.0000 mg | Freq: Once | INTRAMUSCULAR | Status: AC
  Administered 2017-11-16: 4 mg via INTRAVENOUS
  Filled 2017-11-16: qty 2

## 2017-11-16 MED ORDER — SODIUM CHLORIDE 0.9 % IV BOLUS
1000.0000 mL | Freq: Once | INTRAVENOUS | Status: AC
  Administered 2017-11-16: 1000 mL via INTRAVENOUS

## 2017-11-16 MED ORDER — CEPHALEXIN 500 MG PO CAPS
500.0000 mg | ORAL_CAPSULE | Freq: Once | ORAL | Status: AC
  Administered 2017-11-16: 500 mg via ORAL
  Filled 2017-11-16: qty 1

## 2017-11-16 MED ORDER — KETOROLAC TROMETHAMINE 30 MG/ML IJ SOLN
30.0000 mg | Freq: Once | INTRAMUSCULAR | Status: AC
  Administered 2017-11-16: 30 mg via INTRAVENOUS
  Filled 2017-11-16: qty 1

## 2017-11-16 MED ORDER — SODIUM CHLORIDE 0.9 % IV BOLUS: 1000.0000 mL | Freq: Once | INTRAVENOUS | Status: DC

## 2017-11-16 MED ORDER — CEPHALEXIN 500 MG PO CAPS: 500.0000 mg | ORAL_CAPSULE | Freq: Three times a day (TID) | ORAL | 0 refills | Status: AC

## 2017-11-16 NOTE — ED Provider Notes (Signed)
[facility] Emergency Department Provider Note  ____________________________________________   First MD Initiated Contact with Patient 11/16/17 1806     (approximate)  I have reviewed the triage vital signs and the nursing notes.   HISTORY  Chief Complaint Back Pain and Hematuria   HPI Lenox AhrKrystle Renee Totherow is a 32 y.o. female with a history of kidney stones as well as UTI who is presenting with sudden onset left flank pain since this morning.  Says the pain is an 8-9 out of 10 at this time and radiating from her left flank around to her abdomen.  She says that she also has urinary frequency.  Denies fever.  Had gross hematuria which brought her to the hospital for further evaluation.  Does not take any pain meds prior to arrival.  Has a history of kidney stones and has had to have had lithotripsy in the past.  History of a hysterectomy as well as salpingo-oophorectomy.  Denies nausea, vomiting or diarrhea.   Past Medical History:  Diagnosis Date  . Anxiety   . Depression   . Seizures [facility](HCC)     Patient Active Problem List   Diagnosis Date Noted  . Gross hematuria 09/04/2015  . Left flank pain 09/04/2015    Past Surgical History:  Procedure Laterality Date  . ABDOMINAL HYSTERECTOMY    . FRACTURE SURGERY    . HERNIA REPAIR    . OVARIAN CYST SURGERY      Prior to Admission medications   Medication Sig Start Date End Date Taking? Authorizing Provider  ALPRAZolam Prudy Feeler(XANAX) 1 MG tablet Take 1 mg by mouth 4 (four) times daily as needed for anxiety.     [provider]  cyclobenzaprine (FLEXERIL) 10 MG tablet Take 1 tablet (10 mg total) by mouth 3 (three) times daily as needed for muscle spasms. 02/21/16   Beers, Charmayne Sheerharles M, PA-C  DULoxetine HCl (CYMBALTA PO) Take 30 mg by mouth 2 (two) times daily. Reported on 09/04/2015    [provider]  gabapentin (NEURONTIN) 300 MG capsule Reported on 09/04/2015 02/28/12   [provider]    guaiFENesin-codeine 100-10 MG/5ML syrup Take 10 mLs by mouth every 6 (six) hours as needed for cough. Patient not taking: Reported on 09/04/2015 06/04/15   Payton Mccallumonty, [place], MD  ibuprofen (ADVIL,MOTRIN) 800 MG tablet Take 1 tablet (800 mg total) by mouth every 8 (eight) hours as needed. 02/21/16   Beers, Charmayne Sheerharles M, PA-C  lidocaine (XYLOCAINE) 2 % solution 20 ml gargle and spit q 6 hours prn Patient not taking: Reported on 09/04/2015 06/04/15   Payton Mccallumonty, Orlando, MD  oxyCODONE-acetaminophen (PERCOCET/ROXICET) 5-325 MG tablet Reported on 09/04/2015 06/18/15   [provider]  PROVENTIL HFA 108 (90 Base) MCG/ACT inhaler Inhale 2 puffs into the lungs every 4 (four) hours as needed. 08/26/15   [provider]  REXULTI 2 MG TABS Take 1 tablet by mouth daily. 06/26/15   [provider]  traMADol (ULTRAM) 50 MG tablet Take 50 mg by mouth. Reported on 09/04/2015 08/15/15   [provider]  traZODone (DESYREL) 100 MG tablet Take 100 mg by mouth at bedtime as needed. 07/03/15   [provider]  Zolpidem Tartrate (AMBIEN PO) Take 10 mg by mouth daily. Reported on 09/04/2015    [provider]    Allergies Vicodin [hydrocodone-acetaminophen]  Family History  Problem Relation Age of Onset  . Kidney disease Neg Hx   . Bladder Cancer Neg Hx     Social  History Social History   Tobacco Use  . Smoking status: Former Games developer  . Smokeless tobacco: Never Used  . Tobacco comment: quit 3 months  Substance Use Topics  . Alcohol use: No    Alcohol/week: 0.0 oz  . Drug use: Yes    Types: Cocaine, Marijuana    Review of Systems  Constitutional: No fever/chills Eyes: No visual changes. ENT: No sore throat. Cardiovascular: Denies chest pain. Respiratory: Denies shortness of breath. Gastrointestinal: No nausea, no vomiting.  No diarrhea.  No constipation. Genitourinary: Negative for dysuria. Musculoskeletal: As above Skin: Negative for rash. Neurological: Negative for  headaches, focal weakness or numbness.   ____________________________________________   PHYSICAL EXAM:  VITAL SIGNS: ED Triage Vitals  Enc Vitals Group     BP 11/16/17 1715 (!) 116/49     Pulse Rate 11/16/17 1715 79     Resp 11/16/17 1715 16     Temp 11/16/17 1715 97.8 F (36.6 C)     Temp Source 11/16/17 1715 Oral     SpO2 11/16/17 1715 99 %     Weight 11/16/17 1718 250 lb (113.4 kg)     Height 11/16/17 1718 5\' 5"  (1.651 m)     Head Circumference --      Peak Flow --      Pain Score 11/16/17 1717 8     Pain Loc --      Pain Edu? --      Excl. in GC? --     Constitutional: Alert and oriented.  Appears uncomfortable. Eyes: Conjunctivae are normal.  Head: Atraumatic. Nose: No congestion/rhinnorhea. Mouth/Throat: Mucous membranes are moist.  Neck: No stridor.   Cardiovascular: Normal rate, regular rhythm. Grossly normal heart sounds.   Respiratory: Normal respiratory effort.  No retractions. Lungs CTAB. Gastrointestinal: Soft with mild to moderate lower abdominal tenderness to palpation as well as left CVA tenderness to palpation. Musculoskeletal: No lower extremity tenderness nor edema.  No joint effusions. Neurologic:  Normal speech and language. No gross focal neurologic deficits are appreciated. Skin:  Skin is warm, dry and intact. No rash noted. Psychiatric: Mood and affect are normal. Speech and behavior are normal.  ____________________________________________   LABS (all labs ordered are listed, but only abnormal results are displayed)  Labs Reviewed  URINALYSIS, COMPLETE (UACMP) WITH MICROSCOPIC - Abnormal; Notable for the following components:      Result Value   APPearance TURBID (*)    Hgb urine dipstick MODERATE (*)    Protein, ur 100 (*)    Leukocytes, UA MODERATE (*)    RBC / HPF >50 (*)    WBC, UA >50 (*)    Bacteria, UA FEW (*)    Non Squamous Epithelial 0-5 (*)    All other components within normal limits  CBC - Abnormal; Notable for the  following components:   WBC 14.1 (*)    All other components within normal limits  BASIC METABOLIC PANEL   ____________________________________________  EKG   ____________________________________________  RADIOLOGY  CT without acute findings.  No evidence of stones. ____________________________________________   PROCEDURES  Procedure(s) performed:   Procedures  Critical Care performed:   ____________________________________________   INITIAL IMPRESSION / ASSESSMENT AND PLAN / ED COURSE  Pertinent labs & imaging results that were available during my care of the patient were reviewed by me and considered in my medical decision making (see chart for details).  Differential diagnosis includes, but is not limited to, acute appendicitis, renal colic, testicular torsion, urinary tract infection/pyelonephritis, prostatitis,  epididymitis, diverticulitis, small bowel obstruction or ileus, colitis, abdominal aortic aneurysm, gastroenteritis, hernia, etc. As part of my medical decision making, I reviewed the following data within the electronic MEDICAL RECORD NUMBER Notes from prior ED visits  ----------------------------------------- 7:22 PM on 11/16/2017 -----------------------------------------  Patient at this time without any distress.  Likely pyelonephritis.  Urine culture from this past October with pansensitive E. coli.  Will be treated with 3 times daily Keflex for 10 days.  Patient aware of diagnosis as well as treatment plan.  Will be discharged home. ____________________________________________   FINAL CLINICAL IMPRESSION(S) / ED DIAGNOSES  Pyelonephritis.    NEW MEDICATIONS STARTED DURING THIS VISIT:  New Prescriptions   No medications on file     Note:  This document was prepared using Dragon voice recognition software and may include unintentional dictation errors.     Myrna Blazer, MD 11/16/17 Ernestina [place]

## 2017-11-16 NOTE — ED Triage Notes (Signed)
FIRST NURSE NOTE-here for hematuria. Thinks has kidney stone again.  Reports also needs doctor note.  Flank pain. ambulatory.

## 2017-11-16 NOTE — ED Notes (Signed)
Pt. Verbalizes understanding of d/c instructions, medications, and follow-up. VS stable.  Pt. In NAD at time of d/c and denies further concerns regarding this visit. Pt. Stable at the time of departure from the unit, departing unit by the safest and most appropriate manner per that pt condition and limitations with all belongings accounted for. Pt advised to return to the ED at any time for emergent concerns, or for new/worsening symptoms.   

## 2017-11-16 NOTE — ED Triage Notes (Signed)
Pt states she started peeing blood today, urgency and frequency, lower back pain, hx of kidney stones.

## 2018-05-27 ENCOUNTER — Ambulatory Visit
Admission: EM | Admit: 2018-05-27 | Discharge: 2018-05-27 | Disposition: A | Discharge status: Home / Self Care | Payer: Medicaid Other | Attending: Family Medicine | Admitting: Family Medicine

## 2018-05-27 ENCOUNTER — Other Ambulatory Visit: Payer: Self-pay

## 2018-05-27 DIAGNOSIS — G8929 Other chronic pain: Secondary | ICD-10-CM

## 2018-05-27 DIAGNOSIS — M5442 Lumbago with sciatica, left side: Secondary | ICD-10-CM | POA: Diagnosis not present

## 2018-05-27 MED ORDER — MELOXICAM 15 MG PO TABS: 15.0000 mg | ORAL_TABLET | Freq: Every day | ORAL | 0 refills | Status: DC | PRN

## 2018-05-27 MED ORDER — METAXALONE 800 MG PO TABS: 800.0000 mg | ORAL_TABLET | Freq: Three times a day (TID) | ORAL | 0 refills | Status: DC | PRN

## 2018-05-27 NOTE — Discharge Instructions (Signed)
Medications as prescribed.  I recommend seeing Pain management again.  Take care  Dr. Adriana Simas
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
## 2018-05-28 ENCOUNTER — Telehealth: Payer: Self-pay

## 2018-05-28 NOTE — Telephone Encounter (Signed)
Pt called stating her insurance didn't cover her muscle relaxer and wants another prescription sent in that medicaid does cover to [facility] in [place]. I told her we have no idea what her insurance will and will not cover.

## 2018-05-28 NOTE — Telephone Encounter (Signed)
Per Renford Dills, called in rx for pt at [facility] in [place]. Flexeril 10mg  qty 10 no refills. Relayed to pt and instructed her not to drive while taking this medication and also unsure if medicaid will cover this. Pt understood

## 2018-07-03 ENCOUNTER — Emergency Department
Admission: EM | Admit: 2018-07-03 | Discharge: 2018-07-03 | Disposition: A | Discharge status: Home / Self Care | Payer: Medicaid Other | Attending: Emergency Medicine | Admitting: Emergency Medicine

## 2018-07-03 ENCOUNTER — Emergency Department: Payer: Medicaid Other

## 2018-07-03 ENCOUNTER — Other Ambulatory Visit: Payer: Self-pay

## 2018-07-03 ENCOUNTER — Encounter: Payer: Self-pay | Admitting: Emergency Medicine

## 2018-07-03 DIAGNOSIS — F1210 Cannabis abuse, uncomplicated: Secondary | ICD-10-CM | POA: Diagnosis not present

## 2018-07-03 DIAGNOSIS — Z79891 Long term (current) use of opiate analgesic: Secondary | ICD-10-CM | POA: Insufficient documentation

## 2018-07-03 DIAGNOSIS — F1410 Cocaine abuse, uncomplicated: Secondary | ICD-10-CM | POA: Insufficient documentation

## 2018-07-03 DIAGNOSIS — Z79899 Other long term (current) drug therapy: Secondary | ICD-10-CM | POA: Insufficient documentation

## 2018-07-03 DIAGNOSIS — Z87891 Personal history of nicotine dependence: Secondary | ICD-10-CM | POA: Insufficient documentation

## 2018-07-03 DIAGNOSIS — J209 Acute bronchitis, unspecified: Secondary | ICD-10-CM | POA: Diagnosis not present

## 2018-07-03 DIAGNOSIS — R05 Cough: Secondary | ICD-10-CM | POA: Diagnosis not present

## 2018-07-03 DIAGNOSIS — R0981 Nasal congestion: Secondary | ICD-10-CM | POA: Diagnosis present

## 2018-07-03 DIAGNOSIS — B349 Viral infection, unspecified: Secondary | ICD-10-CM | POA: Insufficient documentation

## 2018-07-03 MED ORDER — PREDNISONE 10 MG PO TABS: ORAL_TABLET | ORAL | 0 refills | Status: DC

## 2018-07-03 MED ORDER — BENZONATATE 100 MG PO CAPS: ORAL_CAPSULE | ORAL | 0 refills | Status: DC

## 2018-07-03 NOTE — ED Triage Notes (Addendum)
Patient ambulatory to triage with steady gait, without difficulty or distress noted; st dx with bronchitis this am; has not started taking any meds yet; reports feeling bad, wants flu test; c/o prod cough yellow sputum, sinus draininage/congestion, generalized HA

## 2018-07-03 NOTE — Discharge Instructions (Signed)
Follow-up with your primary care provider if any continued problems.  Begin taking prednisone tapering dose over the next 6 days as directed.  Tessalon Perles 1 or 2 every 8 hours as needed for cough.  Increase fluids.  You may continue taking Tylenol if needed for pain however discontinue taking anti-inflammatories while taking the prednisone.

## 2018-07-03 NOTE — ED Provider Notes (Signed)
[facility] Emergency Department Provider Note   ____________________________________________   First MD Initiated Contact with Patient 07/03/18 [phone]     (approximate)  I have reviewed the triage vital signs and the nursing notes.   HISTORY  Chief Complaint URI  HPI Morgan Daniel is a 32 y.o. female presents to the ED with complaint of sinus and chest congestion along with cough for the last week.  She has been taking over-the-counter medication without any relief.  Patient states she is a former smoker but has not smoked in years.  She denies any history of asthma or pneumonia.  Patient states that she cannot sleep at night because of persistent coughing.  She rates her pain as a 10/10.   Past Medical History:  Diagnosis Date  . Anxiety   . Depression   . Seizures [facility])     Patient Active Problem List   Diagnosis Date Noted  . Gross hematuria 09/04/2015  . Left flank pain 09/04/2015    Past Surgical History:  Procedure Laterality Date  . ABDOMINAL HYSTERECTOMY    . FRACTURE SURGERY    . HERNIA REPAIR    . OVARIAN CYST SURGERY      Prior to Admission medications   Medication Sig Start Date End Date Taking? Authorizing Provider  ALPRAZolam Prudy Feeler) 1 MG tablet Take 1 mg by mouth 4 (four) times daily as needed for anxiety.     [provider]  benzonatate (TESSALON PERLES) 100 MG capsule Take 1 or 2 every 8 hours as needed for cough 07/03/18   Tommi Rumps, PA-C  metaxalone (SKELAXIN) 800 MG tablet Take 1 tablet (800 mg total) by mouth 3 (three) times daily as needed for muscle spasms. 05/27/18   Tommie Sams, DO  predniSONE (DELTASONE) 10 MG tablet Take 6 tablets  today, on day 2 take 5 tablets, day 3 take 4 tablets, day 4 take 3 tablets, day 5 take  2 tablets and 1 tablet the last day 07/03/18   Tommi Rumps, PA-C  PROVENTIL HFA 108 (90 Base) MCG/ACT inhaler Inhale 2 puffs into the lungs every 4 (four) hours as needed.  08/26/15   [provider]  REXULTI 2 MG TABS Take 1 tablet by mouth daily. 06/26/15   [provider]  Zolpidem Tartrate (AMBIEN PO) Take 10 mg by mouth daily. Reported on 09/04/2015    [provider]    Allergies Lidocaine  & camphor-men lotn and Vicodin [hydrocodone-acetaminophen]  Family History  Problem Relation Age of Onset  . Kidney disease Neg Hx   . Bladder Cancer Neg Hx     Social History Social History   Tobacco Use  . Smoking status: Former Games developer  . Smokeless tobacco: Never Used  . Tobacco comment: quit 3 months  Substance Use Topics  . Alcohol use: No    Alcohol/week: 0.0 standard drinks  . Drug use: Yes    Types: Cocaine, Marijuana    Review of Systems Constitutional: No fever/chills Eyes: No visual changes. ENT: No sore throat. Cardiovascular: Denies chest pain. Respiratory: Denies shortness of breath.  Positive persistent nonproductive cough. Gastrointestinal: No abdominal pain.  No nausea, no vomiting.  No diarrhea.   Musculoskeletal: Negative for back pain. Skin: Negative for rash. Neurological: Negative for headaches, focal weakness or numbness. ___________________________________________   PHYSICAL EXAM:  VITAL SIGNS: ED Triage Vitals  Enc Vitals Group     BP 07/03/18 0703 (!) 128/54     Pulse Rate  07/03/18 0703 87     Resp 07/03/18 0703 18     Temp 07/03/18 0703 97.8 F (36.6 C)     Temp Source 07/03/18 0703 Oral     SpO2 07/03/18 0703 97 %     Weight 07/03/18 0704 250 lb (113.4 kg)     Height 07/03/18 0704 5\' 5"  (1.651 m)     Head Circumference --      Peak Flow --      Pain Score 07/03/18 0703 10     Pain Loc --      Pain Edu? --      Excl. in GC? --    Constitutional: Alert and oriented. Well appearing and in no acute distress. Eyes: Conjunctivae are normal.  Head: Atraumatic. Nose: Minimal congestion/rhinnorhea.  EACs are clear.  TMs are dull bilaterally. Mouth/Throat: Mucous membranes are moist.   Oropharynx non-erythematous. Neck: No stridor.   Hematological/Lymphatic/Immunilogical: No cervical lymphadenopathy. Cardiovascular: Normal rate, regular rhythm. Grossly normal heart sounds.  Good peripheral circulation. Respiratory: Normal respiratory effort.  No retractions. Lungs CTAB with intermittent coarse cough. Musculoskeletal: Moves upper and lower extremities any difficulty.  Normal gait was noted. Neurologic:  Normal speech and language. No gross focal neurologic deficits are appreciated.  Skin:  Skin is warm, dry and intact. No rash noted. Psychiatric: Mood and affect are normal. Speech and behavior are normal.  ____________________________________________   LABS (all labs ordered are listed, but only abnormal results are displayed)  Labs Reviewed - No data to display  RADIOLOGY  ED MD interpretation:  Chest x-ray is negative for infiltrate.  Official radiology report(s): Dg Chest 2 View  Result Date: 07/03/2018 CLINICAL DATA:  Cough. EXAM: CHEST - 2 VIEW COMPARISON:  Radiographs of May 03, 2017. FINDINGS: The heart size and mediastinal contours are within normal limits. Both lungs are clear. The visualized skeletal structures are unremarkable. IMPRESSION: No active cardiopulmonary disease. Electronically Signed   By: Lupita RaiderJames  Green Jr, M.D.   On: 07/03/2018 08:23  ____________________________________________   PROCEDURES  Procedure(s) performed: None  Procedures  Critical Care performed: No  ____________________________________________   INITIAL IMPRESSION / ASSESSMENT AND PLAN / ED COURSE  As part of my medical decision making, I reviewed the following data within the electronic MEDICAL RECORD NUMBER Notes from prior ED visits and [place] Controlled Substance Database   Patient presents to the ED with complaint of nonproductive cough and sinus pressure.  Patient has been dealing with this for 1 week with no over-the-counter medications helping her.  Patient was a  former smoker.  Clinically she appears to have bronchitis.  Chest x-ray was reassuring that there was no pneumonia present.  Patient was made aware.  She was given a prescription for prednisone and Tessalon Perles.  She is to follow-up with [facility] P[facility] if any continued problems.  She is encouraged to increase fluids and Tylenol if needed.  ____________________________________________   FINAL CLINICAL IMPRESSION(S) / ED DIAGNOSES  Final diagnoses:  Acute bronchitis, unspecified organism     ED Discharge Orders         Ordered    predniSONE (DELTASONE) 10 MG tablet     07/03/18 0843    benzonatate (TESSALON PERLES) 100 MG capsule     07/03/18 0843           Note:  This document was prepared using Dragon voice recognition software and may include unintentional dictation errors.    Tommi RumpsSummers, Christpher Stogsdill L, PA-C 07/03/18 1258  Minna Antis, MD 07/03/18 1546

## 2018-07-03 NOTE — ED Triage Notes (Signed)
Pt c/o sinus and chest congestion with cough for the past week, has been taking OTC meds with no relief.

## 2018-07-03 NOTE — ED Notes (Signed)
See triage note  Presents with cough,chest congestion and sinus pressure    States sx's started about 5-7 days ago  Unsure of fever but is afebrile on arrival   States she has tried OTC meds for sx's w/o relief

## 2018-07-04 ENCOUNTER — Emergency Department
Admission: EM | Admit: 2018-07-04 | Discharge: 2018-07-04 | Disposition: A | Discharge status: Home / Self Care | Payer: Medicaid Other | Source: Home / Self Care | Attending: Emergency Medicine | Admitting: Emergency Medicine

## 2018-07-04 DIAGNOSIS — B349 Viral infection, unspecified: Secondary | ICD-10-CM

## 2018-07-04 LAB — INFLUENZA PANEL BY PCR (TYPE A & B)
Influenza A By PCR: NEGATIVE
Influenza B By PCR: NEGATIVE

## 2018-07-04 NOTE — ED Notes (Signed)
Pt reporting generalized body aches and intermittent fevers "all day" Headache has been worsening. Pt verbalized "I just feel like this has to be the flu"

## 2018-07-07 NOTE — ED Provider Notes (Signed)
[facility] Emergency Department Provider Note   ____________________________________________    I have reviewed the triage vital signs and the nursing notes.   HISTORY  Chief Complaint Fatigue, achy    HPI Morgan Daniel is a 32 y.o. female who was seen in the emergency department recently and diagnosed with viral illness who presents today requesting flu shot.  She reports that she feels fatigued, achy, has a mild cough as well.  Denies shortness of breath.  Has felt warm, unclear if fevers.  No nausea or vomiting or abdominal pain.  No recent travel.  Past Medical History:  Diagnosis Date  . Anxiety   . Depression   . Seizures [facility])     Patient Active Problem List   Diagnosis Date Noted  . Gross hematuria 09/04/2015  . Left flank pain 09/04/2015    Past Surgical History:  Procedure Laterality Date  . ABDOMINAL HYSTERECTOMY    . FRACTURE SURGERY    . HERNIA REPAIR    . OVARIAN CYST SURGERY      Prior to Admission medications   Medication Sig Start Date End Date Taking? Authorizing Provider  ALPRAZolam Prudy Feeler) 1 MG tablet Take 1 mg by mouth 4 (four) times daily as needed for anxiety.     [provider]  benzonatate (TESSALON PERLES) 100 MG capsule Take 1 or 2 every 8 hours as needed for cough 07/03/18   Tommi Rumps, PA-C  metaxalone (SKELAXIN) 800 MG tablet Take 1 tablet (800 mg total) by mouth 3 (three) times daily as needed for muscle spasms. 05/27/18   Tommie Sams, DO  predniSONE (DELTASONE) 10 MG tablet Take 6 tablets  today, on day 2 take 5 tablets, day 3 take 4 tablets, day 4 take 3 tablets, day 5 take  2 tablets and 1 tablet the last day 07/03/18   Tommi Rumps, PA-C  PROVENTIL HFA 108 (90 Base) MCG/ACT inhaler Inhale 2 puffs into the lungs every 4 (four) hours as needed. 08/26/15   [provider]  REXULTI 2 MG TABS Take 1 tablet by mouth daily. 06/26/15   [provider]  Zolpidem Tartrate  (AMBIEN PO) Take 10 mg by mouth daily. Reported on 09/04/2015    [provider]     Allergies Lidocaine  & camphor-men lotn and Vicodin [hydrocodone-acetaminophen]  Family History  Problem Relation Age of Onset  . Kidney disease Neg Hx   . Bladder Cancer Neg Hx     Social History Social History   Tobacco Use  . Smoking status: Former Games developer  . Smokeless tobacco: Never Used  . Tobacco comment: quit 3 months  Substance Use Topics  . Alcohol use: No    Alcohol/week: 0.0 standard drinks  . Drug use: Yes    Types: Cocaine, Marijuana    Review of Systems  Constitutional: as above  ENT: No sore throat.   Gastrointestinal:No nausea, no vomiting.   Genitourinary: Negative for dysuria. Musculoskeletal: body aches Skin: Negative for rash. Neurological: Negative for headaches     ____________________________________________   PHYSICAL EXAM:  VITAL SIGNS: ED Triage Vitals  Enc Vitals Group     BP 07/03/18 2344 129/71     Pulse Rate 07/03/18 2344 78     Resp 07/03/18 2344 18     Temp 07/03/18 2344 98.4 F (36.9 C)     Temp Source 07/03/18 2344 Oral     SpO2 07/03/18 2344 98 %     Weight  07/03/18 2322 113.4 kg (250 lb)     Height 07/03/18 2322 1.651 m (5\' 5" )     Head Circumference --      Peak Flow --      Pain Score 07/03/18 2322 10     Pain Loc --      Pain Edu? --      Excl. in GC? --      Constitutional: Alert and oriented. No acute distress.  Eyes: Conjunctivae are normal.   Nose: No congestion/rhinnorhea. Mouth/Throat: Mucous membranes are moist.   Cardiovascular: Normal rate, regular rhythm.  Respiratory: Normal respiratory effort.  No retractions.  Musculoskeletal: No lower extremity tenderness nor edema.   Neurologic:  Normal speech and language. No gross focal neurologic deficits are appreciated.   Skin:  Skin is warm, dry and intact. No rash noted.   ____________________________________________   LABS (all labs ordered are listed,  but only abnormal results are displayed)  Labs Reviewed  INFLUENZA PANEL BY PCR (TYPE A & B)   ____________________________________________  EKG   ____________________________________________  RADIOLOGY  None ____________________________________________   PROCEDURES  Procedure(s) performed: No  Procedures   Critical Care performed: No ____________________________________________   INITIAL IMPRESSION / ASSESSMENT AND PLAN / ED COURSE  Pertinent labs & imaging results that were available during my care of the patient were reviewed by me and considered in my medical decision making (see chart for details).  Patient well-appearing and in no acute distress, exam is overall quite reassuring.  Lungs clear to auscultation bilaterally.  Flu test is negative.  She is reassured.  Recommend supportive care, outpatient follow-up.   ____________________________________________   FINAL CLINICAL IMPRESSION(S) / ED DIAGNOSES  Final diagnoses:  Viral illness      NEW MEDICATIONS STARTED DURING THIS VISIT:  Discharge Medication List as of 07/04/2018  1:09 AM       Note:  This document was prepared using Dragon voice recognition software and may include unintentional dictation errors.    Jene EveryKinner, Imya Mance, MD 07/07/18 [phone]
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
## 2018-09-25 ENCOUNTER — Emergency Department
Admission: EM | Admit: 2018-09-25 | Discharge: 2018-09-25 | Disposition: A | Discharge status: Home / Self Care | Payer: Medicaid Other | Attending: Emergency Medicine | Admitting: Emergency Medicine

## 2018-09-25 ENCOUNTER — Emergency Department: Payer: Medicaid Other

## 2018-09-25 ENCOUNTER — Other Ambulatory Visit: Payer: Self-pay

## 2018-09-25 DIAGNOSIS — X500XXA Overexertion from strenuous movement or load, initial encounter: Secondary | ICD-10-CM | POA: Diagnosis not present

## 2018-09-25 DIAGNOSIS — M25532 Pain in left wrist: Secondary | ICD-10-CM

## 2018-09-25 DIAGNOSIS — Y9389 Activity, other specified: Secondary | ICD-10-CM | POA: Diagnosis not present

## 2018-09-25 DIAGNOSIS — Z79899 Other long term (current) drug therapy: Secondary | ICD-10-CM | POA: Insufficient documentation

## 2018-09-25 DIAGNOSIS — Y929 Unspecified place or not applicable: Secondary | ICD-10-CM | POA: Diagnosis not present

## 2018-09-25 DIAGNOSIS — Z87891 Personal history of nicotine dependence: Secondary | ICD-10-CM | POA: Diagnosis not present

## 2018-09-25 DIAGNOSIS — Y999 Unspecified external cause status: Secondary | ICD-10-CM | POA: Insufficient documentation

## 2018-09-25 DIAGNOSIS — S6992XA Unspecified injury of left wrist, hand and finger(s), initial encounter: Secondary | ICD-10-CM | POA: Diagnosis present

## 2018-09-25 DIAGNOSIS — S63502A Unspecified sprain of left wrist, initial encounter: Secondary | ICD-10-CM | POA: Insufficient documentation

## 2018-09-25 MED ORDER — KETOROLAC TROMETHAMINE 30 MG/ML IJ SOLN
30.0000 mg | Freq: Once | INTRAMUSCULAR | Status: AC
  Administered 2018-09-25: 30 mg via INTRAMUSCULAR
  Filled 2018-09-25: qty 1

## 2018-09-25 MED ORDER — OXYCODONE-ACETAMINOPHEN 5-325 MG PO TABS: 1.0000 | ORAL_TABLET | ORAL | 0 refills | Status: DC | PRN

## 2018-09-25 MED ORDER — OXYCODONE-ACETAMINOPHEN 5-325 MG PO TABS
1.0000 | ORAL_TABLET | Freq: Once | ORAL | Status: AC
  Administered 2018-09-25: 1 via ORAL
  Filled 2018-09-25: qty 1

## 2018-09-25 MED ORDER — IBUPROFEN 800 MG PO TABS: 800.0000 mg | ORAL_TABLET | Freq: Three times a day (TID) | ORAL | 0 refills | Status: DC | PRN

## 2018-09-25 NOTE — ED Provider Notes (Signed)
[facility] Emergency Department Provider Note   ____________________________________________   First MD Initiated Contact with Patient 09/25/18 [phone]     (approximate)  I have reviewed the triage vital signs and the nursing notes.   HISTORY  Chief Complaint Wrist Pain    HPI Morgan Daniel is a 33 y.o. female who presents to the ED from home with a chief complaint of left wrist pain.  Patient reports she moved a mattress last evening and subsequently began to have pain along her volar wrist.  Did not fall on the wrist.  Feels better to apply pressure.  Denies extremity weakness, numbness or tingling.  Voices no other complaints or injuries.     Past Medical History:  Diagnosis Date  . Anxiety   . Depression   . Seizures [facility])     Patient Active Problem List   Diagnosis Date Noted  . Gross hematuria 09/04/2015  . Left flank pain 09/04/2015    Past Surgical History:  Procedure Laterality Date  . ABDOMINAL HYSTERECTOMY    . FRACTURE SURGERY    . HERNIA REPAIR    . OVARIAN CYST SURGERY      Prior to Admission medications   Medication Sig Start Date End Date Taking? Authorizing Provider  ALPRAZolam Prudy Feeler) 1 MG tablet Take 1 mg by mouth 4 (four) times daily as needed for anxiety.     [provider]  benzonatate (TESSALON PERLES) 100 MG capsule Take 1 or 2 every 8 hours as needed for cough 07/03/18   Tommi Rumps, PA-C  metaxalone (SKELAXIN) 800 MG tablet Take 1 tablet (800 mg total) by mouth 3 (three) times daily as needed for muscle spasms. 05/27/18   Tommie Sams, DO  predniSONE (DELTASONE) 10 MG tablet Take 6 tablets  today, on day 2 take 5 tablets, day 3 take 4 tablets, day 4 take 3 tablets, day 5 take  2 tablets and 1 tablet the last day 07/03/18   Tommi Rumps, PA-C  PROVENTIL HFA 108 (90 Base) MCG/ACT inhaler Inhale 2 puffs into the lungs every 4 (four) hours as needed. 08/26/15   [provider]  REXULTI 2 MG  TABS Take 1 tablet by mouth daily. 06/26/15   [provider]  Zolpidem Tartrate (AMBIEN PO) Take 10 mg by mouth daily. Reported on 09/04/2015    [provider]    Allergies Lidocaine  & camphor-men lotn and Vicodin [hydrocodone-acetaminophen]  Family History  Problem Relation Age of Onset  . Kidney disease Neg Hx   . Bladder Cancer Neg Hx     Social History Social History   Tobacco Use  . Smoking status: Former Games developer  . Smokeless tobacco: Never Used  . Tobacco comment: quit 3 months  Substance Use Topics  . Alcohol use: No    Alcohol/week: 0.0 standard drinks  . Drug use: Yes    Types: Cocaine, Marijuana    Review of Systems  Constitutional: No fever/chills Eyes: No visual changes. ENT: No sore throat. Cardiovascular: Denies chest pain. Respiratory: Denies shortness of breath. Gastrointestinal: No abdominal pain.  No nausea, no vomiting.  No diarrhea.  No constipation. Genitourinary: Negative for dysuria. Musculoskeletal: Positive for left wrist pain.  Negative for back pain. Skin: Negative for rash. Neurological: Negative for headaches, focal weakness or numbness.   ____________________________________________   PHYSICAL EXAM:  VITAL SIGNS: ED Triage Vitals [09/25/18 0153]  Enc Vitals Group     BP 128/72  Pulse Rate 65     Resp 18     Temp (!) 97.5 F (36.4 C)     Temp Source Oral     SpO2 96 %     Weight 260 lb (117.9 kg)     Height 5\' 5"  (1.651 m)     Head Circumference      Peak Flow      Pain Score 9     Pain Loc      Pain Edu?      Excl. in GC?     Constitutional: Alert and oriented. Well appearing and in mild acute distress. Eyes: Conjunctivae are normal. PERRL. EOMI. Head: Atraumatic. Nose: Atraumatic. Mouth/Throat: Mucous membranes are moist.  No dental malocclusion. Neck: No stridor.  No cervical spine tenderness to palpation. Cardiovascular: Normal rate, regular rhythm. Grossly normal heart sounds.  Good  peripheral circulation. Respiratory: Normal respiratory effort.  No retractions. Lungs CTAB. Gastrointestinal: Soft and nontender. No distention. No abdominal bruits. No CVA tenderness. Musculoskeletal: Left volar wrist mildly swollen.  Decreased range of motion secondary to pain.  2+ radial pulses.  Brisk, less than 5-second capillary refill. Neurologic:  Normal speech and language. No gross focal neurologic deficits are appreciated. No gait instability. Skin:  Skin is warm, dry and intact. No rash noted. Psychiatric: Mood and affect are normal. Speech and behavior are normal.  ____________________________________________   LABS (all labs ordered are listed, but only abnormal results are displayed)  Labs Reviewed - No data to display ____________________________________________  EKG  None ____________________________________________  RADIOLOGY  ED MD interpretation: No acute osseous injury  Official radiology report(s): Dg Wrist Complete Left  Result Date: 09/25/2018 CLINICAL DATA:  Wrist pain EXAM: LEFT WRIST - COMPLETE 3+ VIEW COMPARISON:  None. FINDINGS: No fracture or malalignment. Cyst in the capitate. Soft tissues are unremarkable. IMPRESSION: No acute osseous abnormality. Electronically Signed   By: Jasmine Pang M.D.   On: 09/25/2018 02:58    ____________________________________________   PROCEDURES  Procedure(s) performed (including Critical Care):  Procedures   ____________________________________________   INITIAL IMPRESSION / ASSESSMENT AND PLAN / ED COURSE  As part of my medical decision making, I reviewed the following data within the electronic MEDICAL RECORD NUMBER Nursing notes reviewed and incorporated and Notes from prior ED visits        33 year old female who presents with left wrist strain with swelling and pain.  X-rays negative for osseous injury.  Will administer NSAIDs, analgesia in place and wrist splint.  Patient will follow-up with  orthopedics this week.  Strict return precautions given.  Patient verbalizes understanding agrees with plan of care.      ____________________________________________   FINAL CLINICAL IMPRESSION(S) / ED DIAGNOSES  Final diagnoses:  Left wrist pain  Sprain of left wrist, initial encounter     ED Discharge Orders    None       Note:  This document was prepared using Dragon voice recognition software and may include unintentional dictation errors.   Irean Hong, MD 09/25/18 [phone]

## 2018-09-25 NOTE — ED Triage Notes (Signed)
Pt states that she moved a mattress tonight and started having left wrist pain after, denies feeling it pop or feeling that she injured it, but states soon after started having pian, pt states that her position of comfort is to hold the wrist tightly. Pt has a strong radial pulse and cap refill within normal limits

## 2018-09-25 NOTE — Discharge Instructions (Addendum)
1.  You may take pain medicines as needed (Motrin/Percocet). 2.  You may remove Velcro wrist splint to bathe and sleep. 3.  Return to the ER for worsening symptoms, increased swelling, numbness/tingling or other concerns.

## 2018-11-01 ENCOUNTER — Emergency Department
Admission: EM | Admit: 2018-11-01 | Discharge: 2018-11-01 | Disposition: A | Discharge status: Home / Self Care | Payer: Medicaid Other | Attending: Emergency Medicine | Admitting: Emergency Medicine

## 2018-11-01 ENCOUNTER — Other Ambulatory Visit: Payer: Self-pay

## 2018-11-01 ENCOUNTER — Encounter: Payer: Self-pay | Admitting: Emergency Medicine

## 2018-11-01 DIAGNOSIS — Y999 Unspecified external cause status: Secondary | ICD-10-CM | POA: Insufficient documentation

## 2018-11-01 DIAGNOSIS — Y93E5 Activity, floor mopping and cleaning: Secondary | ICD-10-CM | POA: Insufficient documentation

## 2018-11-01 DIAGNOSIS — S0101XA Laceration without foreign body of scalp, initial encounter: Secondary | ICD-10-CM

## 2018-11-01 DIAGNOSIS — Z87891 Personal history of nicotine dependence: Secondary | ICD-10-CM | POA: Insufficient documentation

## 2018-11-01 DIAGNOSIS — Y929 Unspecified place or not applicable: Secondary | ICD-10-CM | POA: Diagnosis not present

## 2018-11-01 DIAGNOSIS — W2209XA Striking against other stationary object, initial encounter: Secondary | ICD-10-CM | POA: Insufficient documentation

## 2018-11-01 DIAGNOSIS — Z79899 Other long term (current) drug therapy: Secondary | ICD-10-CM | POA: Insufficient documentation

## 2018-11-01 NOTE — ED Provider Notes (Signed)
[facility] Emergency Department Provider Note  ____________________________________________   First MD Initiated Contact with Patient 11/01/18 [phone]     (approximate)  I have reviewed the triage vital signs and the nursing notes.   HISTORY  Chief Complaint Laceration    HPI Morgan Daniel is a 33 y.o. female with medical history as listed below who presents for evaluation of a scalp injury.  She reports that she was cleaning her room and smacked the top of her head against a deer antler which was mounted on a wall.  She said "it really hurt" and there was "a lot of blood".  She was afraid she might need stitches so she came in.  There is no bleeding any longer and no blood in her hair.  She says it is very sore all throughout the top of her head and down into her face.  She did not lose consciousness and does not have a headache other than the pain at the site of the injury and has no neck pain.  She sustained no other injuries.  She describes it as severe.  She got a tetanus shot a year ago.          Past Medical History:  Diagnosis Date  . Anxiety   . Depression   . Seizures [facility])     Patient Active Problem List   Diagnosis Date Noted  . Gross hematuria 09/04/2015  . Left flank pain 09/04/2015    Past Surgical History:  Procedure Laterality Date  . ABDOMINAL HYSTERECTOMY    . FRACTURE SURGERY    . HERNIA REPAIR    . OVARIAN CYST SURGERY      Prior to Admission medications   Medication Sig Start Date End Date Taking? Authorizing Provider  ALPRAZolam Prudy Feeler) 1 MG tablet Take 1 mg by mouth 4 (four) times daily as needed for anxiety.     [provider]  benzonatate (TESSALON PERLES) 100 MG capsule Take 1 or 2 every 8 hours as needed for cough 07/03/18   Bridget Hartshorn L, PA-C  ibuprofen (ADVIL,MOTRIN) 800 MG tablet Take 1 tablet (800 mg total) by mouth every 8 (eight) hours as needed for moderate pain. 09/25/18   Irean Hong, MD   metaxalone (SKELAXIN) 800 MG tablet Take 1 tablet (800 mg total) by mouth 3 (three) times daily as needed for muscle spasms. 05/27/18   Tommie Sams, DO  oxyCODONE-acetaminophen (PERCOCET/ROXICET) 5-325 MG tablet Take 1 tablet by mouth every 4 (four) hours as needed for severe pain. 09/25/18   Irean Hong, MD  predniSONE (DELTASONE) 10 MG tablet Take 6 tablets  today, on day 2 take 5 tablets, day 3 take 4 tablets, day 4 take 3 tablets, day 5 take  2 tablets and 1 tablet the last day 07/03/18   Tommi Rumps, PA-C  PROVENTIL HFA 108 (90 Base) MCG/ACT inhaler Inhale 2 puffs into the lungs every 4 (four) hours as needed. 08/26/15   [provider]  REXULTI 2 MG TABS Take 1 tablet by mouth daily. 06/26/15   [provider]  Zolpidem Tartrate (AMBIEN PO) Take 10 mg by mouth daily. Reported on 09/04/2015    [provider]    Allergies Lidocaine  & camphor-men lotn and Vicodin [hydrocodone-acetaminophen]  Family History  Problem Relation Age of Onset  . Kidney disease Neg Hx   . Bladder Cancer Neg Hx     Social History Social History   Tobacco Use  .  Smoking status: Former Games developermoker  . Smokeless tobacco: Never Used  . Tobacco comment: quit 3 months  Substance Use Topics  . Alcohol use: No    Alcohol/week: 0.0 standard drinks  . Drug use: Yes    Types: Cocaine, Marijuana    Review of Systems Constitutional: No fever/chills Cardiovascular: Denies chest pain. Respiratory: Denies shortness of breath. Gastrointestinal: No abdominal pain.  No nausea, no vomiting.   Musculoskeletal: Negative for neck pain.  Negative for back pain. Integumentary: Small laceration to scalp.  Negative for rash. Neurological: Negative for headaches, focal weakness or numbness.   ____________________________________________   PHYSICAL EXAM:  VITAL SIGNS: ED Triage Vitals  Enc Vitals Group     BP 11/01/18 0327 (!) 121/53     Pulse Rate 11/01/18 0327 74     Resp 11/01/18 0327 16      Temp 11/01/18 0327 (!) 97.5 F (36.4 C)     Temp Source 11/01/18 0327 Oral     SpO2 11/01/18 0327 96 %     Weight 11/01/18 0328 118.4 kg (261 lb)     Height 11/01/18 0328 1.651 m (5\' 5" )     Head Circumference --      Peak Flow --      Pain Score 11/01/18 0328 10     Pain Loc --      Pain Edu? --      Excl. in GC? --     Constitutional: Alert and oriented. Well appearing and in no acute distress. Eyes: Conjunctivae are normal.  Head: The patient has a 3 mm superficial laceration to the right side of her scalp just above the hairline.  It is well-appearing and not currently bleeding. cardiovascular: Normal rate, regular rhythm. Good peripheral circulation. Respiratory: Normal respiratory effort.  No retractions.  Musculoskeletal: No lower extremity tenderness nor edema. No gross deformities of extremities. Neurologic:  Normal speech and language. No gross focal neurologic deficits are appreciated.  Skin:  Skin is warm, dry and intact. No rash noted. Psychiatric: Mood and affect are normal. Speech and behavior are normal.  ____________________________________________   LABS (all labs ordered are listed, but only abnormal results are displayed)  Labs Reviewed - No data to display ____________________________________________  EKG  No indication for EKG ____________________________________________  RADIOLOGY   ED MD interpretation: No indication for imaging  Official radiology report(s): No results found.  ____________________________________________   PROCEDURES   Procedure(s) performed (including Critical Care):  Procedures   ____________________________________________   INITIAL IMPRESSION / MDM / ASSESSMENT AND PLAN / ED COURSE  As part of my medical decision making, I reviewed the following data within the electronic MEDICAL RECORD NUMBER Nursing notes reviewed and incorporated, Old chart reviewed, Notes from prior ED visits and [place] Controlled Substance  Database  Lenox AhrKrystle Renee Trupiano was evaluated in Emergency Department on 11/01/2018 for the symptoms described in the history of present illness. She was evaluated in the context of the global COVID-19 pandemic, which necessitated consideration that the patient might be at risk for infection with the SARS-CoV-2 virus that causes COVID-19. Institutional protocols and algorithms that pertain to the evaluation of patients at risk for COVID-19 are in a state of rapid change based on information released by regulatory bodies including the CDC and federal and state organizations. These policies and algorithms were followed during the patient's care in the ED.      The patient is well-appearing and in no distress.  She has a very small laceration that does not  require any treatment including no staples or sutures.  It is already closed and I told her to wash it well with soap and water at home and let it heal on its own.  No indication for imaging based on [place] head CT rules and NEXUS criteria.  While I was preparing her discharge paperwork she left without waiting for the papers.  She states she is up-to-date on her tetanus vaccination.     ____________________________________________  FINAL CLINICAL IMPRESSION(S) / ED DIAGNOSES  Final diagnoses:  Laceration of scalp, initial encounter     MEDICATIONS GIVEN DURING THIS VISIT:  Medications - No data to display   ED Discharge Orders    None       Note:  This document was prepared using Dragon voice recognition software and may include unintentional dictation errors.   Loleta Rose, MD 11/01/18 [phone]

## 2018-11-01 NOTE — ED Triage Notes (Signed)
Pt arrived to the ED for complaints of a 71mm laceration secondary to something falling on her head. Pt is AOx4 in no apparent distress no bleeding at this time, denies LOC.

## 2018-11-01 NOTE — Discharge Instructions (Addendum)
You were evaluated for a small laceration to your scalp.  There is no indication for staples or sutures at this time.  You may wash your hair normally and keep the wound clean and dry.  It may ooze a little bit but will heal on its own.  Return to the emergency department if you develop new or worsening symptoms that concern you.

## 2019-03-07 ENCOUNTER — Other Ambulatory Visit: Payer: Self-pay

## 2019-03-07 ENCOUNTER — Encounter: Payer: Self-pay | Admitting: Emergency Medicine

## 2019-03-07 ENCOUNTER — Emergency Department
Admission: EM | Admit: 2019-03-07 | Discharge: 2019-03-07 | Disposition: A | Discharge status: Home / Self Care | Payer: Medicaid Other | Attending: Student in an Organized Health Care Education/Training Program | Admitting: Student in an Organized Health Care Education/Training Program

## 2019-03-07 DIAGNOSIS — S39012A Strain of muscle, fascia and tendon of lower back, initial encounter: Secondary | ICD-10-CM | POA: Insufficient documentation

## 2019-03-07 DIAGNOSIS — Y939 Activity, unspecified: Secondary | ICD-10-CM | POA: Insufficient documentation

## 2019-03-07 DIAGNOSIS — L089 Local infection of the skin and subcutaneous tissue, unspecified: Secondary | ICD-10-CM | POA: Diagnosis not present

## 2019-03-07 DIAGNOSIS — Y999 Unspecified external cause status: Secondary | ICD-10-CM | POA: Diagnosis not present

## 2019-03-07 DIAGNOSIS — Y929 Unspecified place or not applicable: Secondary | ICD-10-CM | POA: Diagnosis not present

## 2019-03-07 DIAGNOSIS — S3992XA Unspecified injury of lower back, initial encounter: Secondary | ICD-10-CM | POA: Diagnosis present

## 2019-03-07 DIAGNOSIS — S50862A Insect bite (nonvenomous) of left forearm, initial encounter: Secondary | ICD-10-CM | POA: Insufficient documentation

## 2019-03-07 DIAGNOSIS — W57XXXA Bitten or stung by nonvenomous insect and other nonvenomous arthropods, initial encounter: Secondary | ICD-10-CM | POA: Insufficient documentation

## 2019-03-07 DIAGNOSIS — X58XXXA Exposure to other specified factors, initial encounter: Secondary | ICD-10-CM | POA: Insufficient documentation

## 2019-03-07 DIAGNOSIS — Z79899 Other long term (current) drug therapy: Secondary | ICD-10-CM | POA: Diagnosis not present

## 2019-03-07 DIAGNOSIS — Z87891 Personal history of nicotine dependence: Secondary | ICD-10-CM | POA: Insufficient documentation

## 2019-03-07 MED ORDER — METHYLPREDNISOLONE 4 MG PO TBPK: ORAL_TABLET | ORAL | 0 refills | Status: DC

## 2019-03-07 MED ORDER — TRAMADOL HCL 50 MG PO TABS: 50.0000 mg | ORAL_TABLET | Freq: Four times a day (QID) | ORAL | 0 refills | Status: AC | PRN

## 2019-03-07 MED ORDER — ORPHENADRINE CITRATE 30 MG/ML IJ SOLN
60.0000 mg | Freq: Two times a day (BID) | INTRAMUSCULAR | Status: DC
  Administered 2019-03-07: 60 mg via INTRAMUSCULAR
  Filled 2019-03-07: qty 2

## 2019-03-07 MED ORDER — CYCLOBENZAPRINE HCL 10 MG PO TABS: 10.0000 mg | ORAL_TABLET | Freq: Three times a day (TID) | ORAL | 0 refills | Status: DC | PRN

## 2019-03-07 MED ORDER — PREDNISONE 20 MG PO TABS
60.0000 mg | ORAL_TABLET | Freq: Once | ORAL | Status: AC
  Administered 2019-03-07: 13:00:00 60 mg via ORAL
  Filled 2019-03-07: qty 3

## 2019-03-07 MED ORDER — SULFAMETHOXAZOLE-TRIMETHOPRIM 800-160 MG PO TABS: 1.0000 | ORAL_TABLET | Freq: Two times a day (BID) | ORAL | 0 refills | Status: DC

## 2019-03-07 MED ORDER — HYDROMORPHONE HCL 1 MG/ML IJ SOLN
1.0000 mg | Freq: Once | INTRAMUSCULAR | Status: AC
  Administered 2019-03-07: 1 mg via INTRAMUSCULAR
  Filled 2019-03-07: qty 1

## 2019-03-07 NOTE — ED Notes (Signed)
Pt verbalized understanding of discharge instructions. NAD at this time. 

## 2019-03-07 NOTE — ED Provider Notes (Signed)
[facility] Emergency Department Provider Note   ____________________________________________   First MD Initiated Contact with Patient 03/07/19 1232     (approximate)  I have reviewed the triage vital signs and the nursing notes.   HISTORY  Chief Complaint No chief complaint on file.    HPI Morgan Daniel is a 33 y.o. female patient complain low back pain and infected insect bite to the left forearm.  Both complaints occurred 2 days ago.  Patient stated no provocative incident for neck pain.  Patient has a history of compression fracture to T11 and T12.  Patient denies radicular component to her onset of low back pain.  Patient denies bladder bowel dysfunction.  Patient believes she was bit by a spider 2 days ago.  Area is now edematous and erythematous.  No drainage.  No palliative measures for either complaint.         Past Medical History:  Diagnosis Date  . Anxiety   . Depression   . Seizures [facility](HCC)     Patient Active Problem List   Diagnosis Date Noted  . Gross hematuria 09/04/2015  . Left flank pain 09/04/2015    Past Surgical History:  Procedure Laterality Date  . ABDOMINAL HYSTERECTOMY    . FRACTURE SURGERY    . HERNIA REPAIR    . OVARIAN CYST SURGERY      Prior to Admission medications   Medication Sig Start Date End Date Taking? Authorizing Provider  ALPRAZolam Prudy Feeler(XANAX) 1 MG tablet Take 1 mg by mouth 4 (four) times daily as needed for anxiety.     [provider]  benzonatate (TESSALON PERLES) 100 MG capsule Take 1 or 2 every 8 hours as needed for cough 07/03/18   Bridget HartshornSummers, Rhonda L, PA-C  cyclobenzaprine (FLEXERIL) 10 MG tablet Take 1 tablet (10 mg total) by mouth 3 (three) times daily as needed. 03/07/19   Joni ReiningSmith, Louella Medaglia K, PA-C  ibuprofen (ADVIL,MOTRIN) 800 MG tablet Take 1 tablet (800 mg total) by mouth every 8 (eight) hours as needed for moderate pain. 09/25/18   Irean HongSung, Jade J, MD  metaxalone (SKELAXIN) 800 MG tablet  Take 1 tablet (800 mg total) by mouth 3 (three) times daily as needed for muscle spasms. 05/27/18   Tommie Samsook, Jayce G, DO  methylPREDNISolone (MEDROL DOSEPAK) 4 MG TBPK tablet Take Tapered dose as directed 03/07/19   Joni ReiningSmith, Nanea Jared K, PA-C  oxyCODONE-acetaminophen (PERCOCET/ROXICET) 5-325 MG tablet Take 1 tablet by mouth every 4 (four) hours as needed for severe pain. 09/25/18   Irean HongSung, Jade J, MD  predniSONE (DELTASONE) 10 MG tablet Take 6 tablets  today, on day 2 take 5 tablets, day 3 take 4 tablets, day 4 take 3 tablets, day 5 take  2 tablets and 1 tablet the last day 07/03/18   Tommi RumpsSummers, Rhonda L, PA-C  PROVENTIL HFA 108 (90 Base) MCG/ACT inhaler Inhale 2 puffs into the lungs every 4 (four) hours as needed. 08/26/15   [provider]  REXULTI 2 MG TABS Take 1 tablet by mouth daily. 06/26/15   [provider]  sulfamethoxazole-trimethoprim (BACTRIM DS) 800-160 MG tablet Take 1 tablet by mouth 2 (two) times daily. 03/07/19   Joni ReiningSmith, Cammeron Greis K, PA-C  traMADol (ULTRAM) 50 MG tablet Take 1 tablet (50 mg total) by mouth every 6 (six) hours as needed for up to 3 days. 03/07/19 03/10/19  Joni ReiningSmith, Johnelle Tafolla K, PA-C  Zolpidem Tartrate (AMBIEN PO) Take 10 mg by mouth daily. Reported on 09/04/2015  [provider]    Allergies Lidocaine & camphor-men lotn and Vicodin [hydrocodone-acetaminophen]  Family History  Problem Relation Age of Onset  . Kidney disease Neg Hx   . Bladder Cancer Neg Hx     Social History Social History   Tobacco Use  . Smoking status: Former Games developermoker  . Smokeless tobacco: Never Used  . Tobacco comment: quit 3 months  Substance Use Topics  . Alcohol use: No    Alcohol/week: 0.0 standard drinks  . Drug use: Yes    Types: Cocaine, Marijuana    Review of Systems  Constitutional: No fever/chills Eyes: No visual changes. ENT: No sore throat. Cardiovascular: Denies chest pain. Respiratory: Denies shortness of breath. Gastrointestinal: No abdominal pain.  No nausea, no  vomiting.  No diarrhea.  No constipation. Genitourinary: Negative for dysuria. Musculoskeletal: Positive for back pain. Skin: Negative for rash.  Edema and erythema left forearm. Neurological: Negative for headaches, focal weakness or numbness. Psychiatric:  Anxiety and depression. Allergic/Immunilogical: See medication list. ____________________________________________   PHYSICAL EXAM:  VITAL SIGNS: ED Triage Vitals [03/07/19 1158]  Enc Vitals Group     BP 96/70     Pulse Rate 74     Resp 16     Temp 98.4 F (36.9 C)     Temp Source Oral     SpO2 99 %     Weight 260 lb (117.9 kg)     Height 5\' 5"  (1.651 m)     Head Circumference      Peak Flow      Pain Score 9     Pain Loc      Pain Edu?      Excl. in [place]?    Constitutional: Alert and oriented. Well appearing and in no acute distress.  Morbid obesity. Cardiovascular: Normal rate, regular rhythm. Grossly normal heart sounds.  Good peripheral circulation. Respiratory: Normal respiratory effort.  No retractions. Lungs CTAB. Gastrointestinal: Soft and nontender. No distention. No abdominal bruits. No CVA tenderness. Genitourinary: Deferred Musculoskeletal: Body habitus limits the exam of the lumbar spine.  Moderate guarding palpation of L4-S1.Marland Kitchen.   Neurologic:  Normal speech and language. No gross focal neurologic deficits are appreciated. No gait instability. Skin:  Skin is warm, dry and intact.  Edema and erythema left forearm. Psychiatric: Mood and affect are normal. Speech and behavior are normal.  ____________________________________________   LABS (all labs ordered are listed, but only abnormal results are displayed)  Labs Reviewed - No data to display ____________________________________________  EKG   ____________________________________________  RADIOLOGY  ED MD interpretation:    Official radiology report(s): No results found.  ____________________________________________   PROCEDURES   Procedure(s) performed (including Critical Care):  Procedures   ____________________________________________   INITIAL IMPRESSION / ASSESSMENT AND PLAN / ED COURSE  As part of my medical decision making, I reviewed the following data within the electronic MEDICAL RECORD NUMBER Morgan Daniel was evaluated in Emergency Department on 03/07/2019 for the symptoms described in the history of present illness. She was evaluated in the context of the global COVID-19 pandemic, which necessitated consideration that the patient might be at risk for infection with the SARS-CoV-2 virus that causes COVID-19. Institutional protocols and algorithms that pertain to the evaluation of patients at risk for COVID-19 are in a state of rapid change based on information released by regulatory bodies including the CDC and federal and state organizations. These policies and algorithms were followed during the patient's care in the ED.  Patient presents with low back pain and infected insect bite of the left forearm.  Patient given discharge care instructions and advised take medication as directed.  Patient advised follow-up PCP.      ____________________________________________   FINAL CLINICAL IMPRESSION(S) / ED DIAGNOSES  Final diagnoses:  Strain of lumbar region, initial encounter  Infected insect bite of left forearm, initial encounter     ED Discharge Orders         Ordered    traMADol (ULTRAM) 50 MG tablet  Every 6 hours PRN     03/07/19 1248    cyclobenzaprine (FLEXERIL) 10 MG tablet  3 times daily PRN     03/07/19 1249    methylPREDNISolone (MEDROL DOSEPAK) 4 MG TBPK tablet     03/07/19 1249    sulfamethoxazole-trimethoprim (BACTRIM DS) 800-160 MG tablet  2 times daily     03/07/19 1249           Note:  This document was prepared using Dragon voice recognition software and may include unintentional dictation errors.    Sable Feil, PA-C 03/07/19 1256    Merlyn Lot, MD 03/07/19 1348

## 2019-03-07 NOTE — ED Triage Notes (Signed)
Pt c/o lower back pain, hx of back pain and states she was bit by brown recluse xfew days also. Denies fever.

## 2019-04-03 ENCOUNTER — Other Ambulatory Visit: Payer: Self-pay | Admitting: Family Medicine

## 2019-04-19 ENCOUNTER — Emergency Department: Payer: Medicaid Other

## 2019-04-19 ENCOUNTER — Encounter: Payer: Self-pay | Admitting: Emergency Medicine

## 2019-04-19 ENCOUNTER — Emergency Department
Admission: EM | Admit: 2019-04-19 | Discharge: 2019-04-19 | Disposition: A | Discharge status: Home / Self Care | Payer: Medicaid Other | Attending: Emergency Medicine | Admitting: Emergency Medicine

## 2019-04-19 ENCOUNTER — Other Ambulatory Visit: Payer: Self-pay

## 2019-04-19 DIAGNOSIS — S9032XA Contusion of left foot, initial encounter: Secondary | ICD-10-CM | POA: Diagnosis not present

## 2019-04-19 DIAGNOSIS — Z79899 Other long term (current) drug therapy: Secondary | ICD-10-CM | POA: Insufficient documentation

## 2019-04-19 DIAGNOSIS — S99922A Unspecified injury of left foot, initial encounter: Secondary | ICD-10-CM | POA: Diagnosis present

## 2019-04-19 DIAGNOSIS — Z87891 Personal history of nicotine dependence: Secondary | ICD-10-CM | POA: Insufficient documentation

## 2019-04-19 DIAGNOSIS — Y939 Activity, unspecified: Secondary | ICD-10-CM | POA: Insufficient documentation

## 2019-04-19 DIAGNOSIS — Y929 Unspecified place or not applicable: Secondary | ICD-10-CM | POA: Diagnosis not present

## 2019-04-19 DIAGNOSIS — W208XXA Other cause of strike by thrown, projected or falling object, initial encounter: Secondary | ICD-10-CM | POA: Diagnosis not present

## 2019-04-19 DIAGNOSIS — Y999 Unspecified external cause status: Secondary | ICD-10-CM | POA: Diagnosis not present

## 2019-04-19 MED ORDER — OXYCODONE-ACETAMINOPHEN 5-325 MG PO TABS: 1.0000 | ORAL_TABLET | Freq: Four times a day (QID) | ORAL | 0 refills | Status: DC | PRN

## 2019-04-19 MED ORDER — NAPROXEN 500 MG PO TABS: 500.0000 mg | ORAL_TABLET | Freq: Two times a day (BID) | ORAL | 0 refills | Status: AC

## 2019-04-19 MED ORDER — OXYCODONE-ACETAMINOPHEN 5-325 MG PO TABS
1.0000 | ORAL_TABLET | Freq: Once | ORAL | Status: AC
  Administered 2019-04-19: 1 via ORAL
  Filled 2019-04-19: qty 1

## 2019-04-19 NOTE — Discharge Instructions (Addendum)
Follow-up with your primary care provider if any continued problems.  If any worsening of your symptoms or not improving follow-up with the podiatrist on call who is Dr. Luana Shu.  He is in the [facility] department and his contact information is listed on your discharge papers.  Ice and elevation to reduce swelling and help with pain.  Also Ace wrap and postop shoe is for added support.  Medication was sent to your pharmacy.

## 2019-04-19 NOTE — ED Provider Notes (Signed)
[facility] Emergency Department Provider Note   ____________________________________________   First MD Initiated Contact with Patient 04/19/19 1315     (approximate)  I have reviewed the triage vital signs and the nursing notes.   HISTORY  Chief Complaint Foot Injury   HPI Jameela Michna is a 33 y.o. female presents to the ED with complaint of left foot pain since last evening.  Patient states that she was wearing a pair of slides when she dropped a drill on top of her foot.  Patient states she is taken ibuprofen 800 mg approximately 4 hours ago without any relief.  Patient is having extreme pain with walking.  She rates her pain as 10/10.       Past Medical History:  Diagnosis Date  . Anxiety   . Depression   . Seizures [facility])     Patient Active Problem List   Diagnosis Date Noted  . Gross hematuria 09/04/2015  . Left flank pain 09/04/2015    Past Surgical History:  Procedure Laterality Date  . ABDOMINAL HYSTERECTOMY    . FRACTURE SURGERY    . HERNIA REPAIR    . OVARIAN CYST SURGERY      Prior to Admission medications   Medication Sig Start Date End Date Taking? Authorizing Provider  ALPRAZolam Prudy Feeler) 1 MG tablet Take 1 mg by mouth 4 (four) times daily as needed for anxiety.     [provider]  naproxen (NAPROSYN) 500 MG tablet Take 1 tablet (500 mg total) by mouth 2 (two) times daily with a meal. 04/19/19   Tommi Rumps, PA-C  oxyCODONE-acetaminophen (PERCOCET) 5-325 MG tablet Take 1 tablet by mouth every 6 (six) hours as needed for severe pain. 04/19/19   Tommi Rumps, PA-C  PROVENTIL HFA 108 (90 Base) MCG/ACT inhaler Inhale 2 puffs into the lungs every 4 (four) hours as needed. 08/26/15   [provider]  REXULTI 2 MG TABS Take 1 tablet by mouth daily. 06/26/15   [provider]  Zolpidem Tartrate (AMBIEN PO) Take 10 mg by mouth daily. Reported on 09/04/2015    [provider]     Allergies Lidocaine & camphor-men lotn and Vicodin [hydrocodone-acetaminophen]  Family History  Problem Relation Age of Onset  . Kidney disease Neg Hx   . Bladder Cancer Neg Hx     Social History Social History   Tobacco Use  . Smoking status: Former Games developer  . Smokeless tobacco: Never Used  . Tobacco comment: quit 3 months  Substance Use Topics  . Alcohol use: No    Alcohol/week: 0.0 standard drinks  . Drug use: Yes    Types: Cocaine, Marijuana    Review of Systems Constitutional: No fever/chills Cardiovascular: Denies chest pain. Respiratory: Denies shortness of breath. Musculoskeletal: Positive left foot pain. Skin: Negative for rash. Neurological: Negative for headaches, focal weakness or numbness. ___________________________________________   PHYSICAL EXAM:  VITAL SIGNS: ED Triage Vitals  Enc Vitals Group     BP 04/19/19 1309 138/78     Pulse Rate 04/19/19 1309 88     Resp --      Temp 04/19/19 1309 98.3 F (36.8 C)     Temp Source 04/19/19 1309 Oral     SpO2 04/19/19 1309 99 %     Weight 04/19/19 1303 260 lb (117.9 kg)     Height 04/19/19 1303 5\' 5"  (1.651 m)     Head Circumference --      Peak Flow --  Pain Score 04/19/19 1302 10     Pain Loc --      Pain Edu? --      Excl. in [place]? --    Constitutional: Alert and oriented. Well appearing and in no acute distress. Eyes: Conjunctivae are normal.  Head: Atraumatic. Neck: No stridor.   Cardiovascular: Normal rate, regular rhythm. Grossly normal heart sounds.  Good peripheral circulation. Respiratory: Normal respiratory effort.  No retractions. Lungs CTAB. Musculoskeletal: On examination of the left foot there is moderate tenderness on palpation of the medial aspect dorsum.  There is no soft tissue swelling appreciated however there is an ecchymotic area and patient is point tender.  She is able to move digits distally and motor sensory function intact.  No abrasions or lacerations noted. Neurologic:   Normal speech and language. No gross focal neurologic deficits are appreciated. No gait instability. Skin:  Skin is warm, dry and intact.  Psychiatric: Mood and affect are normal. Speech and behavior are normal.  ____________________________________________   LABS (all labs ordered are listed, but only abnormal results are displayed)  Labs Reviewed - No data to display  RADIOLOGY  Official radiology report(s): Dg Foot Complete Left  Result Date: 04/19/2019 CLINICAL DATA:  Pt reports dropped a drill on her left foot last night and now it is painful and hurts to walk on. No previous fx. EXAM: LEFT FOOT - COMPLETE 3+ VIEW COMPARISON:  None. FINDINGS: There is no evidence of fracture or dislocation. There is no evidence of arthropathy or other focal bone abnormality. Soft tissues are unremarkable. IMPRESSION: Negative. Electronically Signed   By: Lajean Manes M.D.   On: 04/19/2019 13:41    ____________________________________________   PROCEDURES  Procedure(s) performed (including Critical Care):  Procedures  Ace wrap, wooden shoe and crutches were arranged for the patient. ____________________________________________   INITIAL IMPRESSION / ASSESSMENT AND PLAN / ED COURSE  As part of my medical decision making, I reviewed the following data within the electronic MEDICAL RECORD NUMBER Notes from prior ED visits and [place] Controlled Substance Database  33 year old female presents to the ED with complaint of left foot pain after she dropped a drill on her foot while wearing a pair of sandals.  Patient states that she took ibuprofen 800 mg 4 hours prior to arrival and states that this is not helped with her pain.  Physical exam with no gross deformity however there is an ecchymotic area noted on the dorsal aspect with moderate tenderness.  X-rays were negative for fracture.  Patient was placed in a postop shoe and given crutches.  She is to follow-up with her PCP or Dr. Luana Shu if any continued  problems.  Medications were sent to her pharmacy.   ____________________________________________   FINAL CLINICAL IMPRESSION(S) / ED DIAGNOSES  Final diagnoses:  Contusion of left foot, initial encounter     ED Discharge Orders         Ordered    naproxen (NAPROSYN) 500 MG tablet  2 times daily with meals     04/19/19 1404    oxyCODONE-acetaminophen (PERCOCET) 5-325 MG tablet  Every 6 hours PRN     04/19/19 1404           Note:  This document was prepared using Dragon voice recognition software and may include unintentional dictation errors.    Johnn Hai, PA-C 04/19/19 1419    Earleen Newport, MD 04/19/19 1440

## 2019-04-19 NOTE — ED Triage Notes (Signed)
Pt reports dropped a drill on her left foot last night and now it is painful and hurts to walk on.

## 2019-04-19 NOTE — ED Notes (Signed)
Patient declined discharge vital signs. 

## 2019-07-22 ENCOUNTER — Other Ambulatory Visit: Payer: Self-pay

## 2019-07-22 ENCOUNTER — Emergency Department
Admission: EM | Admit: 2019-07-22 | Discharge: 2019-07-22 | Disposition: A | Discharge status: Home / Self Care | Payer: Medicaid Other | Attending: Emergency Medicine | Admitting: Emergency Medicine

## 2019-07-22 ENCOUNTER — Encounter: Payer: Self-pay | Admitting: Emergency Medicine

## 2019-07-22 ENCOUNTER — Emergency Department: Payer: Medicaid Other

## 2019-07-22 DIAGNOSIS — W19XXXA Unspecified fall, initial encounter: Secondary | ICD-10-CM

## 2019-07-22 DIAGNOSIS — L03115 Cellulitis of right lower limb: Secondary | ICD-10-CM | POA: Insufficient documentation

## 2019-07-22 DIAGNOSIS — Y9301 Activity, walking, marching and hiking: Secondary | ICD-10-CM | POA: Insufficient documentation

## 2019-07-22 DIAGNOSIS — S8991XA Unspecified injury of right lower leg, initial encounter: Secondary | ICD-10-CM | POA: Diagnosis present

## 2019-07-22 DIAGNOSIS — Y92019 Unspecified place in single-family (private) house as the place of occurrence of the external cause: Secondary | ICD-10-CM | POA: Diagnosis not present

## 2019-07-22 DIAGNOSIS — W010XXA Fall on same level from slipping, tripping and stumbling without subsequent striking against object, initial encounter: Secondary | ICD-10-CM | POA: Insufficient documentation

## 2019-07-22 DIAGNOSIS — Z87891 Personal history of nicotine dependence: Secondary | ICD-10-CM | POA: Insufficient documentation

## 2019-07-22 DIAGNOSIS — Y999 Unspecified external cause status: Secondary | ICD-10-CM | POA: Insufficient documentation

## 2019-07-22 DIAGNOSIS — S80211A Abrasion, right knee, initial encounter: Secondary | ICD-10-CM | POA: Insufficient documentation

## 2019-07-22 MED ORDER — CLINDAMYCIN HCL 150 MG PO CAPS
300.0000 mg | ORAL_CAPSULE | Freq: Once | ORAL | Status: AC
  Administered 2019-07-22: 06:00:00 300 mg via ORAL
  Filled 2019-07-22: qty 2

## 2019-07-22 MED ORDER — OXYCODONE-ACETAMINOPHEN 5-325 MG PO TABS
1.0000 | ORAL_TABLET | Freq: Once | ORAL | Status: AC
  Administered 2019-07-22: 06:00:00 1 via ORAL
  Filled 2019-07-22: qty 1

## 2019-07-22 MED ORDER — CLINDAMYCIN HCL 300 MG PO CAPS: 300.0000 mg | ORAL_CAPSULE | Freq: Three times a day (TID) | ORAL | 0 refills | Status: AC

## 2019-07-22 MED ORDER — IBUPROFEN 800 MG PO TABS
800.0000 mg | ORAL_TABLET | Freq: Once | ORAL | Status: AC
  Administered 2019-07-22: 05:00:00 800 mg via ORAL
  Filled 2019-07-22: qty 1

## 2019-07-22 MED ORDER — OXYCODONE-ACETAMINOPHEN 5-325 MG PO TABS: 1.0000 | ORAL_TABLET | ORAL | 0 refills | Status: AC | PRN

## 2019-07-22 NOTE — ED Triage Notes (Signed)
Pt report slipped and fell due to the rain and now with pain to right knee and lower back. Pt also reports pain to both wrists and numbness as well.

## 2019-07-22 NOTE — ED Triage Notes (Signed)
Pt unable to sit still in the WC. Pt sometimes talking with her eyes closed. Pt denies taking any medications or other substances.

## 2019-07-22 NOTE — ED Notes (Signed)
Patient given written and verbal discharge instructions. Patient verbalizes understanding. Signature pad not working at this time.

## 2019-07-22 NOTE — ED Provider Notes (Signed)
[facility] Emergency Department Provider Note  ____________________________________________  Time seen: Approximately 6:21 AM  I have reviewed the triage vital signs and the nursing notes.   HISTORY  Chief Complaint Knee Pain, Fall, and Wrist Pain   HPI Morgan Daniel is a 33 y.o. female who presents for evaluation of right knee pain.  Patient reports 2 days ago she had a fall and sustained an abrasion on her right knee.  Over the last 24 hours she has had progressively worsening pain and redness of the knee.  She has been taking Tylenol and ibuprofen at home with no relief.  She is complaining of severe throbbing constant pain that is worse with weightbearing.  She denies head trauma or LOC.   She reports having some wrist pain and back pain however that has resolved. No fever, nausea, or vomiting  Past Medical History:  Diagnosis Date  . Anxiety   . Depression   . Seizures [facility])     Patient Active Problem List   Diagnosis Date Noted  . Gross hematuria 09/04/2015  . Left flank pain 09/04/2015    Past Surgical History:  Procedure Laterality Date  . ABDOMINAL HYSTERECTOMY    . FRACTURE SURGERY    . HERNIA REPAIR    . OVARIAN CYST SURGERY      Prior to Admission medications   Medication Sig Start Date End Date Taking? Authorizing Provider  ALPRAZolam Prudy Feeler) 1 MG tablet Take 1 mg by mouth 4 (four) times daily as needed for anxiety.     [provider]  clindamycin (CLEOCIN) 300 MG capsule Take 1 capsule (300 mg total) by mouth 3 (three) times daily for 10 days. 07/22/19 08/01/19  Nita Sickle, MD  naproxen (NAPROSYN) 500 MG tablet Take 1 tablet (500 mg total) by mouth 2 (two) times daily with a meal. 04/19/19   Tommi Rumps, PA-C  oxyCODONE-acetaminophen (PERCOCET) 5-325 MG tablet Take 1 tablet by mouth every 4 (four) hours as needed. 07/22/19   Nita Sickle, MD  PROVENTIL HFA 108 [phone] Base) MCG/ACT inhaler Inhale 2 puffs  into the lungs every 4 (four) hours as needed. 08/26/15   [provider]  REXULTI 2 MG TABS Take 1 tablet by mouth daily. 06/26/15   [provider]  Zolpidem Tartrate (AMBIEN PO) Take 10 mg by mouth daily. Reported on 09/04/2015    [provider]    Allergies Lidocaine & camphor-men lotn and Vicodin [hydrocodone-acetaminophen]  Family History  Problem Relation Age of Onset  . Kidney disease Neg Hx   . Bladder Cancer Neg Hx     Social History Social History   Tobacco Use  . Smoking status: Former Games developer  . Smokeless tobacco: Never Used  . Tobacco comment: quit 3 months  Substance Use Topics  . Alcohol use: No    Alcohol/week: 0.0 standard drinks  . Drug use: Yes    Types: Cocaine, Marijuana    Review of Systems  Constitutional: Negative for fever. Eyes: Negative for visual changes. ENT: Negative for sore throat. Neck: No neck pain  Cardiovascular: Negative for chest pain. Respiratory: Negative for shortness of breath. Gastrointestinal: Negative for abdominal pain, vomiting or diarrhea. Genitourinary: Negative for dysuria. Musculoskeletal: Negative for back pain. + R knee pain Skin: Negative for rash. Neurological: Negative for headaches, weakness or numbness. Psych: No SI or HI  ____________________________________________   PHYSICAL EXAM:  VITAL SIGNS: ED Triage Vitals  Enc Vitals Group     BP 07/22/19  0349 113/90     Pulse Rate 07/22/19 0349 70     Resp 07/22/19 0349 20     Temp 07/22/19 0349 97.8 F (36.6 C)     Temp Source 07/22/19 0349 Oral     SpO2 07/22/19 0349 100 %     Weight 07/22/19 0350 265 lb (120.2 kg)     Height 07/22/19 0350 5\' 9"  (1.753 m)     Head Circumference --      Peak Flow --      Pain Score 07/22/19 0350 10     Pain Loc --      Pain Edu? --      Excl. in [place]? --     Constitutional: Alert and oriented. Well appearing and in no apparent distress. HEENT:      Head: Normocephalic and atraumatic.          Eyes: Conjunctivae are normal. Sclera is non-icteric.       Mouth/Throat: Mucous membranes are moist.       Neck: Supple with no signs of meningismus.  No C-spine tenderness Cardiovascular: Regular rate and rhythm.  Respiratory: Normal respiratory effort.  Musculoskeletal: There is an abrasion over the right patella with surrounding erythema and warmth, normal range of motion of the joint.  No T and L-spine tenderness, hips have full painless range of motion. Neurologic: Normal speech and language. Face is symmetric. Moving all extremities. No gross focal neurologic deficits are appreciated. Skin: Skin is warm, dry and intact. No rash noted. Psychiatric: Mood and affect are normal. Speech and behavior are normal.  ____________________________________________   LABS (all labs ordered are listed, but only abnormal results are displayed)  Labs Reviewed - No data to display ____________________________________________  EKG  none  ____________________________________________  RADIOLOGY  I have personally reviewed the images performed during this visit and I agree with the Radiologist's read.   Interpretation by Radiologist:  DG Knee 2 Views Right  Result Date: 07/22/2019 CLINICAL DATA:  Fall 2 days ago with right knee pain. EXAM: RIGHT KNEE - 1-2 VIEW COMPARISON:  None. FINDINGS: No evidence of fracture, dislocation, or joint effusion. No evidence of arthropathy or other focal bone abnormality. Soft tissues are unremarkable. IMPRESSION: Negative. Electronically Signed   By: Marin Olp M.D.   On: 07/22/2019 04:14     ____________________________________________   PROCEDURES  Procedure(s) performed: None Procedures Critical Care performed:  None ____________________________________________   INITIAL IMPRESSION / ASSESSMENT AND PLAN / ED COURSE  33 y.o. female who presents for evaluation of right knee pain.  Exam is consistent with an abrasion caused by the fall  complicated by cellulitis.  No signs of septic joint with full range of motion of the knee.  Bedside ultrasound showing no fluid in the joint.  X-ray showing no dislocation or fracture.  No signs of sepsis with no fever or systemic symptoms.  Patient was started on clindamycin, given a short course of Percocet for pain.  The cellulitic area was demarcated.  Patient recommended to return to the emergency room if the area spreading or if she has a fever.  Otherwise follow-up with primary care doctor.       As part of my medical decision making, I reviewed the following data within the [place] notes reviewed and incorporated, Old chart reviewed, Radiograph reviewed , Notes from prior ED visits and  Controlled Substance Database   Please note:  Patient was evaluated in Emergency Department today for the symptoms  described in the history of present illness. Patient was evaluated in the context of the global COVID-19 pandemic, which necessitated consideration that the patient might be at risk for infection with the SARS-CoV-2 virus that causes COVID-19. Institutional protocols and algorithms that pertain to the evaluation of patients at risk for COVID-19 are in a state of rapid change based on information released by regulatory bodies including the CDC and federal and state organizations. These policies and algorithms were followed during the patient's care in the ED.  Some ED evaluations and interventions may be delayed as a result of limited staffing during the pandemic.   ____________________________________________   FINAL CLINICAL IMPRESSION(S) / ED DIAGNOSES   Final diagnoses:  Fall, initial encounter  Abrasion of right knee, initial encounter  Cellulitis of right lower extremity      NEW MEDICATIONS STARTED DURING THIS VISIT:  ED Discharge Orders         Ordered    oxyCODONE-acetaminophen (PERCOCET) 5-325 MG tablet  Every 4 hours PRN     07/22/19 0620     clindamycin (CLEOCIN) 300 MG capsule  3 times daily     07/22/19 0620           Note:  This document was prepared using Dragon voice recognition software and may include unintentional dictation errors.    Nita SickleVeronese, , MD 07/22/19 [phone]

## 2019-07-22 NOTE — ED Notes (Signed)
Pt requesting IV for pain medication. Pt encouraged to try the ibuprofen. Pt states she will try but it will not help.

## 2019-07-22 NOTE — ED Triage Notes (Signed)
Pt offered ibuprofen or tylenol for pain. Pt declined at this time and states they will not work

## 2019-07-22 NOTE — Discharge Instructions (Signed)

## 2019-07-22 NOTE — ED Triage Notes (Signed)
Pt in via EMS from home with c/o fall 2 days ago. Pt c/o pain to arms, wrist and right knee. Right knee pain is from the fall. Wrist/arm pain is from carpal tunnel. VS stable. Distal pulses present.

## 2020-05-11 ENCOUNTER — Ambulatory Visit
Admission: EM | Admit: 2020-05-11 | Discharge: 2020-05-11 | Disposition: A | Discharge status: Home / Self Care | Payer: Medicaid Other

## 2020-05-11 ENCOUNTER — Other Ambulatory Visit: Payer: Self-pay

## 2020-05-11 ENCOUNTER — Encounter: Payer: Self-pay | Admitting: Emergency Medicine

## 2020-05-11 DIAGNOSIS — R0981 Nasal congestion: Secondary | ICD-10-CM | POA: Diagnosis not present

## 2020-05-11 DIAGNOSIS — J069 Acute upper respiratory infection, unspecified: Secondary | ICD-10-CM

## 2020-05-11 NOTE — Discharge Instructions (Addendum)
URI/COLD SYMPTOMS: Your exam today is consistent with a viral illness. Antibiotics are not indicated at this time. Use medications as directed, including cough syrup, nasal saline, and decongestants. Your symptoms should improve over the next few days and resolve within 7-10 days. Increase rest and fluids. F/u if symptoms worsen or predominate such as sore throat, ear pain, productive cough, shortness of breath, or if you develop high fevers or worsening fatigue over the next several days.    

## 2020-05-11 NOTE — ED Provider Notes (Signed)
[facility]    CSN: [id number] Arrival date & time: 05/11/20  0941      History   Chief Complaint Chief Complaint  Patient presents with  . Sinus Problem    HPI Morgan Daniel is a 34 y.o. female presenting for onset of nasal congestion and pressure as well as headaches yesterday.  Patient states "I know that I have a sinus infection."  She said that she has had sinus infections before and feels that she has one.  Patient denies any associated fevers, fatigue, body aches, sore throat, ear pain, cough, chest congestion or pressure, breathing difficulty, nausea, vomiting, diarrhea.  Denies any smell or taste change.  Patient says she has not taken any over-the-counter medications for symptoms.  Patient denies any known Covid exposure and has been vaccinated for Covid.  She states that she does not want a Covid test because she knows it is Covid and she had a negative test 2 weeks ago.  Denies any history of cardiopulmonary disease.  No other complaints or concerns.  HPI  Past Medical History:  Diagnosis Date  . Anxiety   . Depression   . Seizures [facility])     Patient Active Problem List   Diagnosis Date Noted  . Gross hematuria 09/04/2015  . Left flank pain 09/04/2015    Past Surgical History:  Procedure Laterality Date  . ABDOMINAL HYSTERECTOMY    . FRACTURE SURGERY    . HERNIA REPAIR    . OVARIAN CYST SURGERY      OB History   No obstetric history on file.      Home Medications    Prior to Admission medications   Medication Sig Start Date End Date Taking? Authorizing Provider  ALPRAZolam Prudy Feeler) 1 MG tablet Take 1 mg by mouth 4 (four) times daily as needed for anxiety.    Yes [provider]  PROVENTIL HFA 108 (90 Base) MCG/ACT inhaler Inhale 2 puffs into the lungs every 4 (four) hours as needed. 08/26/15  Yes [provider]  Zolpidem Tartrate (AMBIEN PO) Take 10 mg by mouth daily. Reported on 09/04/2015   Yes [provider]  naproxen (NAPROSYN) 500 MG tablet Take 1 tablet (500 mg total) by mouth 2 (two) times daily with a meal. 04/19/19   Tommi Rumps, PA-C  oxyCODONE-acetaminophen (PERCOCET) 5-325 MG tablet Take 1 tablet by mouth every 4 (four) hours as needed. 07/22/19   Nita Sickle, MD  REXULTI 2 MG TABS Take 1 tablet by mouth daily. 06/26/15   [provider]    Family History Family History  Problem Relation Age of Onset  . Kidney disease Neg Hx   . Bladder Cancer Neg Hx     Social History Social History   Tobacco Use  . Smoking status: Former Games developer  . Smokeless tobacco: Never Used  . Tobacco comment: quit 3 months  Vaping Use  . Vaping Use: Never used  Substance Use Topics  . Alcohol use: No    Alcohol/week: 0.0 standard drinks  . Drug use: Yes    Types: Cocaine, Marijuana     Allergies   Lidocaine & camphor-men lotn and Vicodin [hydrocodone-acetaminophen]   Review of Systems Review of Systems  Constitutional: Negative for chills, diaphoresis, fatigue and fever.  HENT: Positive for congestion, rhinorrhea, sinus pressure and sinus pain. Negative for ear pain and sore throat.   Respiratory: Negative for cough and shortness of breath.   Gastrointestinal: Negative for abdominal pain, nausea and  vomiting.  Musculoskeletal: Negative for arthralgias and myalgias.  Skin: Negative for rash.  Neurological: Positive for headaches. Negative for weakness.  Hematological: Negative for adenopathy.     Physical Exam Triage Vital Signs ED Triage Vitals  Enc Vitals Group     BP 05/11/20 1000 98/72     Pulse Rate 05/11/20 1000 64     Resp 05/11/20 1000 14     Temp 05/11/20 1000 97.6 F (36.4 C)     Temp Source 05/11/20 1000 Oral     SpO2 05/11/20 1000 98 %     Weight 05/11/20 0956 260 lb (117.9 kg)     Height 05/11/20 0956 5\' 5"  (1.651 m)     Head Circumference --      Peak Flow --      Pain Score 05/11/20 0956 9     Pain Loc --      Pain Edu? --      Excl. in  GC? --    No data found.  Updated Vital Signs BP 98/72 (BP Location: Left Arm)   Pulse 64   Temp 97.6 F (36.4 C) (Oral)   Resp 14   Ht 5\' 5"  (1.651 m)   Wt 260 lb (117.9 kg)   SpO2 98%   BMI 43.27 kg/m       Physical Exam Vitals and nursing note reviewed.  Constitutional:      General: She is not in acute distress.    Appearance: Normal appearance. She is obese. She is not ill-appearing or toxic-appearing.  HENT:     Head: Normocephalic and atraumatic.     Right Ear: Tympanic membrane, ear canal and external ear normal.     Left Ear: Tympanic membrane, ear canal and external ear normal.     Nose: Congestion and rhinorrhea present.     Right Sinus: No maxillary sinus tenderness or frontal sinus tenderness.     Left Sinus: No maxillary sinus tenderness or frontal sinus tenderness.     Mouth/Throat:     Mouth: Mucous membranes are moist.     Pharynx: Oropharynx is clear. No posterior oropharyngeal erythema.  Eyes:     General: No scleral icterus.       Right eye: No discharge.        Left eye: No discharge.     Conjunctiva/sclera: Conjunctivae normal.  Cardiovascular:     Rate and Rhythm: Normal rate and regular rhythm.     Heart sounds: Normal heart sounds.  Pulmonary:     Effort: Pulmonary effort is normal. No respiratory distress.     Breath sounds: Normal breath sounds.  Musculoskeletal:     Cervical back: Neck supple.  Skin:    General: Skin is dry.  Neurological:     General: No focal deficit present.     Mental Status: She is alert. Mental status is at baseline.     Motor: No weakness.     Gait: Gait normal.  Psychiatric:        Mood and Affect: Mood normal.        Behavior: Behavior normal.        Thought Content: Thought content normal.      UC Treatments / Results  Labs (all labs ordered are listed, but only abnormal results are displayed) Labs Reviewed - No data to display  EKG   Radiology No results found.  Procedures Procedures  (including critical care time)  Medications Ordered in UC Medications - No data to display  Initial Impression / Assessment and Plan / UC Course  I have reviewed the triage vital signs and the nursing notes.  Pertinent labs & imaging results that were available during my care of the patient were reviewed by me and considered in my medical decision making (see chart for details).   34 year old female presenting for 1 day history of sinus congestion insisting that she has a sinus infection.  Patient declined Covid testing even after insisted that her test from 2 weeks ago is not valid for her current symptoms.  Patient says she has a sinus infection I explained that she likely has an upper respiratory infection and we cannot rule out Covid without a test.  Explained that she will likely be sick for about a week or so with viral infection she does not need antibiotic this time.  She has not taken any over-the-counter medications I advised her to begin doing so at this time.  Advised Mucinex D and Flonase.  Advised increasing fluid and rest.  Patient very unhappy and declined discharge papers and Covid testing.  She actually left the exam room while I was speaking with her.  Advised patient to follow-up as needed.   Final Clinical Impressions(s) / UC Diagnoses   Final diagnoses:  Acute upper respiratory infection  Nasal congestion     Discharge Instructions     URI/COLD SYMPTOMS: Your exam today is consistent with a viral illness. Antibiotics are not indicated at this time. Use medications as directed, including cough syrup, nasal saline, and decongestants. Your symptoms should improve over the next few days and resolve within 7-10 days. Increase rest and fluids. F/u if symptoms worsen or predominate such as sore throat, ear pain, productive cough, shortness of breath, or if you develop high fevers or worsening fatigue over the next several days.      ED Prescriptions    None     PDMP not  reviewed this encounter.   Shirlee Latch, PA-C 05/11/20 1104

## 2020-05-11 NOTE — ED Triage Notes (Signed)
Patient c/o sinus congestion and pressure and headaches that started yesterday.  Patient denies fevers.
